# Patient Record
Sex: Female | Born: 1945
Health system: Southern US, Community
[De-identification: ages and names within clinical notes are randomized; demographics above are authoritative.]

## PROBLEM LIST (undated history)

## (undated) DIAGNOSIS — E785 Hyperlipidemia, unspecified: Secondary | ICD-10-CM

## (undated) DIAGNOSIS — I10 Essential (primary) hypertension: Secondary | ICD-10-CM

## (undated) HISTORY — PX: REPLACEMENT TOTAL KNEE: SUR1224

---

## 2017-02-07 DIAGNOSIS — N289 Disorder of kidney and ureter, unspecified: Secondary | ICD-10-CM | POA: Diagnosis not present

## 2017-02-27 DIAGNOSIS — Z79899 Other long term (current) drug therapy: Secondary | ICD-10-CM | POA: Diagnosis not present

## 2017-02-27 DIAGNOSIS — R748 Abnormal levels of other serum enzymes: Secondary | ICD-10-CM | POA: Diagnosis not present

## 2017-02-27 DIAGNOSIS — N2889 Other specified disorders of kidney and ureter: Secondary | ICD-10-CM | POA: Diagnosis not present

## 2017-02-27 DIAGNOSIS — R109 Unspecified abdominal pain: Secondary | ICD-10-CM | POA: Diagnosis not present

## 2017-02-27 DIAGNOSIS — K869 Disease of pancreas, unspecified: Secondary | ICD-10-CM | POA: Diagnosis not present

## 2017-03-03 DIAGNOSIS — I10 Essential (primary) hypertension: Secondary | ICD-10-CM | POA: Diagnosis not present

## 2017-03-05 DIAGNOSIS — N183 Chronic kidney disease, stage 3 (moderate): Secondary | ICD-10-CM | POA: Diagnosis not present

## 2017-03-05 DIAGNOSIS — I10 Essential (primary) hypertension: Secondary | ICD-10-CM | POA: Diagnosis not present

## 2017-03-05 DIAGNOSIS — N390 Urinary tract infection, site not specified: Secondary | ICD-10-CM | POA: Diagnosis not present

## 2017-04-10 DIAGNOSIS — I739 Peripheral vascular disease, unspecified: Secondary | ICD-10-CM | POA: Diagnosis not present

## 2017-04-10 DIAGNOSIS — N032 Chronic nephritic syndrome with diffuse membranous glomerulonephritis: Secondary | ICD-10-CM | POA: Diagnosis not present

## 2017-04-10 DIAGNOSIS — Z0000 Encounter for general adult medical examination without abnormal findings: Secondary | ICD-10-CM | POA: Diagnosis not present

## 2017-04-10 DIAGNOSIS — I119 Hypertensive heart disease without heart failure: Secondary | ICD-10-CM | POA: Diagnosis not present

## 2017-04-10 DIAGNOSIS — E559 Vitamin D deficiency, unspecified: Secondary | ICD-10-CM | POA: Diagnosis not present

## 2017-04-10 DIAGNOSIS — I10 Essential (primary) hypertension: Secondary | ICD-10-CM | POA: Diagnosis not present

## 2017-04-10 DIAGNOSIS — E785 Hyperlipidemia, unspecified: Secondary | ICD-10-CM | POA: Diagnosis not present

## 2017-04-10 DIAGNOSIS — N183 Chronic kidney disease, stage 3 (moderate): Secondary | ICD-10-CM | POA: Diagnosis not present

## 2017-04-10 DIAGNOSIS — E039 Hypothyroidism, unspecified: Secondary | ICD-10-CM | POA: Diagnosis not present

## 2017-04-10 DIAGNOSIS — L409 Psoriasis, unspecified: Secondary | ICD-10-CM | POA: Diagnosis not present

## 2017-05-15 DIAGNOSIS — N289 Disorder of kidney and ureter, unspecified: Secondary | ICD-10-CM | POA: Diagnosis not present

## 2017-07-10 DIAGNOSIS — N183 Chronic kidney disease, stage 3 (moderate): Secondary | ICD-10-CM | POA: Diagnosis not present

## 2017-07-10 DIAGNOSIS — E039 Hypothyroidism, unspecified: Secondary | ICD-10-CM | POA: Diagnosis not present

## 2017-07-10 DIAGNOSIS — I739 Peripheral vascular disease, unspecified: Secondary | ICD-10-CM | POA: Diagnosis not present

## 2017-07-10 DIAGNOSIS — L409 Psoriasis, unspecified: Secondary | ICD-10-CM | POA: Diagnosis not present

## 2017-07-10 DIAGNOSIS — E785 Hyperlipidemia, unspecified: Secondary | ICD-10-CM | POA: Diagnosis not present

## 2017-07-10 DIAGNOSIS — E559 Vitamin D deficiency, unspecified: Secondary | ICD-10-CM | POA: Diagnosis not present

## 2017-07-10 DIAGNOSIS — I119 Hypertensive heart disease without heart failure: Secondary | ICD-10-CM | POA: Diagnosis not present

## 2017-07-10 DIAGNOSIS — N032 Chronic nephritic syndrome with diffuse membranous glomerulonephritis: Secondary | ICD-10-CM | POA: Diagnosis not present

## 2017-07-10 DIAGNOSIS — I10 Essential (primary) hypertension: Secondary | ICD-10-CM | POA: Diagnosis not present

## 2017-08-12 DIAGNOSIS — Z1231 Encounter for screening mammogram for malignant neoplasm of breast: Secondary | ICD-10-CM | POA: Diagnosis not present

## 2017-08-14 DIAGNOSIS — N289 Disorder of kidney and ureter, unspecified: Secondary | ICD-10-CM | POA: Diagnosis not present

## 2017-08-14 DIAGNOSIS — N200 Calculus of kidney: Secondary | ICD-10-CM | POA: Diagnosis not present

## 2017-09-01 DIAGNOSIS — N183 Chronic kidney disease, stage 3 (moderate): Secondary | ICD-10-CM | POA: Diagnosis not present

## 2017-09-01 DIAGNOSIS — I10 Essential (primary) hypertension: Secondary | ICD-10-CM | POA: Diagnosis not present

## 2017-09-02 DIAGNOSIS — K219 Gastro-esophageal reflux disease without esophagitis: Secondary | ICD-10-CM | POA: Diagnosis not present

## 2017-09-02 DIAGNOSIS — K869 Disease of pancreas, unspecified: Secondary | ICD-10-CM | POA: Diagnosis not present

## 2017-09-02 DIAGNOSIS — N2889 Other specified disorders of kidney and ureter: Secondary | ICD-10-CM | POA: Diagnosis not present

## 2017-09-02 DIAGNOSIS — R109 Unspecified abdominal pain: Secondary | ICD-10-CM | POA: Diagnosis not present

## 2017-09-03 DIAGNOSIS — E041 Nontoxic single thyroid nodule: Secondary | ICD-10-CM | POA: Diagnosis not present

## 2017-09-03 DIAGNOSIS — R109 Unspecified abdominal pain: Secondary | ICD-10-CM | POA: Diagnosis not present

## 2017-09-04 DIAGNOSIS — N390 Urinary tract infection, site not specified: Secondary | ICD-10-CM | POA: Diagnosis not present

## 2017-09-04 DIAGNOSIS — I10 Essential (primary) hypertension: Secondary | ICD-10-CM | POA: Diagnosis not present

## 2017-09-04 DIAGNOSIS — N183 Chronic kidney disease, stage 3 (moderate): Secondary | ICD-10-CM | POA: Diagnosis not present

## 2017-10-09 DIAGNOSIS — I739 Peripheral vascular disease, unspecified: Secondary | ICD-10-CM | POA: Diagnosis not present

## 2017-10-09 DIAGNOSIS — E559 Vitamin D deficiency, unspecified: Secondary | ICD-10-CM | POA: Diagnosis not present

## 2017-10-09 DIAGNOSIS — Z23 Encounter for immunization: Secondary | ICD-10-CM | POA: Diagnosis not present

## 2017-10-09 DIAGNOSIS — I119 Hypertensive heart disease without heart failure: Secondary | ICD-10-CM | POA: Diagnosis not present

## 2017-10-09 DIAGNOSIS — I10 Essential (primary) hypertension: Secondary | ICD-10-CM | POA: Diagnosis not present

## 2017-10-09 DIAGNOSIS — M79672 Pain in left foot: Secondary | ICD-10-CM | POA: Diagnosis not present

## 2017-10-09 DIAGNOSIS — N183 Chronic kidney disease, stage 3 (moderate): Secondary | ICD-10-CM | POA: Diagnosis not present

## 2017-10-09 DIAGNOSIS — L409 Psoriasis, unspecified: Secondary | ICD-10-CM | POA: Diagnosis not present

## 2017-10-09 DIAGNOSIS — E039 Hypothyroidism, unspecified: Secondary | ICD-10-CM | POA: Diagnosis not present

## 2017-10-09 DIAGNOSIS — E785 Hyperlipidemia, unspecified: Secondary | ICD-10-CM | POA: Diagnosis not present

## 2017-10-09 DIAGNOSIS — N032 Chronic nephritic syndrome with diffuse membranous glomerulonephritis: Secondary | ICD-10-CM | POA: Diagnosis not present

## 2017-10-09 DIAGNOSIS — R635 Abnormal weight gain: Secondary | ICD-10-CM | POA: Diagnosis not present

## 2017-10-13 DIAGNOSIS — M7732 Calcaneal spur, left foot: Secondary | ICD-10-CM | POA: Diagnosis not present

## 2017-10-13 DIAGNOSIS — M722 Plantar fascial fibromatosis: Secondary | ICD-10-CM | POA: Diagnosis not present

## 2017-10-13 DIAGNOSIS — M7752 Other enthesopathy of left foot: Secondary | ICD-10-CM | POA: Diagnosis not present

## 2017-11-03 DIAGNOSIS — M7752 Other enthesopathy of left foot: Secondary | ICD-10-CM | POA: Diagnosis not present

## 2017-11-03 DIAGNOSIS — M722 Plantar fascial fibromatosis: Secondary | ICD-10-CM | POA: Diagnosis not present

## 2017-11-06 DIAGNOSIS — E039 Hypothyroidism, unspecified: Secondary | ICD-10-CM | POA: Diagnosis not present

## 2017-11-06 DIAGNOSIS — E559 Vitamin D deficiency, unspecified: Secondary | ICD-10-CM | POA: Diagnosis not present

## 2017-11-06 DIAGNOSIS — N032 Chronic nephritic syndrome with diffuse membranous glomerulonephritis: Secondary | ICD-10-CM | POA: Diagnosis not present

## 2017-11-06 DIAGNOSIS — E785 Hyperlipidemia, unspecified: Secondary | ICD-10-CM | POA: Diagnosis not present

## 2017-11-06 DIAGNOSIS — I10 Essential (primary) hypertension: Secondary | ICD-10-CM | POA: Diagnosis not present

## 2017-11-06 DIAGNOSIS — I119 Hypertensive heart disease without heart failure: Secondary | ICD-10-CM | POA: Diagnosis not present

## 2017-11-06 DIAGNOSIS — L409 Psoriasis, unspecified: Secondary | ICD-10-CM | POA: Diagnosis not present

## 2017-11-06 DIAGNOSIS — R635 Abnormal weight gain: Secondary | ICD-10-CM | POA: Diagnosis not present

## 2017-11-06 DIAGNOSIS — N183 Chronic kidney disease, stage 3 (moderate): Secondary | ICD-10-CM | POA: Diagnosis not present

## 2017-11-17 DIAGNOSIS — N289 Disorder of kidney and ureter, unspecified: Secondary | ICD-10-CM | POA: Diagnosis not present

## 2017-11-17 DIAGNOSIS — M722 Plantar fascial fibromatosis: Secondary | ICD-10-CM | POA: Diagnosis not present

## 2017-11-17 DIAGNOSIS — M7752 Other enthesopathy of left foot: Secondary | ICD-10-CM | POA: Diagnosis not present

## 2018-02-05 DIAGNOSIS — I119 Hypertensive heart disease without heart failure: Secondary | ICD-10-CM | POA: Diagnosis not present

## 2018-02-05 DIAGNOSIS — R300 Dysuria: Secondary | ICD-10-CM | POA: Diagnosis not present

## 2018-02-05 DIAGNOSIS — E039 Hypothyroidism, unspecified: Secondary | ICD-10-CM | POA: Diagnosis not present

## 2018-02-05 DIAGNOSIS — N032 Chronic nephritic syndrome with diffuse membranous glomerulonephritis: Secondary | ICD-10-CM | POA: Diagnosis not present

## 2018-02-05 DIAGNOSIS — E785 Hyperlipidemia, unspecified: Secondary | ICD-10-CM | POA: Diagnosis not present

## 2018-02-05 DIAGNOSIS — L409 Psoriasis, unspecified: Secondary | ICD-10-CM | POA: Diagnosis not present

## 2018-02-05 DIAGNOSIS — E559 Vitamin D deficiency, unspecified: Secondary | ICD-10-CM | POA: Diagnosis not present

## 2018-02-05 DIAGNOSIS — N183 Chronic kidney disease, stage 3 (moderate): Secondary | ICD-10-CM | POA: Diagnosis not present

## 2018-02-05 DIAGNOSIS — R635 Abnormal weight gain: Secondary | ICD-10-CM | POA: Diagnosis not present

## 2018-02-05 DIAGNOSIS — I10 Essential (primary) hypertension: Secondary | ICD-10-CM | POA: Diagnosis not present

## 2018-02-17 DIAGNOSIS — I10 Essential (primary) hypertension: Secondary | ICD-10-CM | POA: Diagnosis not present

## 2018-02-17 DIAGNOSIS — R635 Abnormal weight gain: Secondary | ICD-10-CM | POA: Diagnosis not present

## 2018-02-17 DIAGNOSIS — I119 Hypertensive heart disease without heart failure: Secondary | ICD-10-CM | POA: Diagnosis not present

## 2018-02-17 DIAGNOSIS — L409 Psoriasis, unspecified: Secondary | ICD-10-CM | POA: Diagnosis not present

## 2018-02-17 DIAGNOSIS — M25561 Pain in right knee: Secondary | ICD-10-CM | POA: Diagnosis not present

## 2018-02-17 DIAGNOSIS — N183 Chronic kidney disease, stage 3 (moderate): Secondary | ICD-10-CM | POA: Diagnosis not present

## 2018-02-17 DIAGNOSIS — N032 Chronic nephritic syndrome with diffuse membranous glomerulonephritis: Secondary | ICD-10-CM | POA: Diagnosis not present

## 2018-02-17 DIAGNOSIS — E785 Hyperlipidemia, unspecified: Secondary | ICD-10-CM | POA: Diagnosis not present

## 2018-02-17 DIAGNOSIS — E039 Hypothyroidism, unspecified: Secondary | ICD-10-CM | POA: Diagnosis not present

## 2018-02-17 DIAGNOSIS — E559 Vitamin D deficiency, unspecified: Secondary | ICD-10-CM | POA: Diagnosis not present

## 2018-02-23 DIAGNOSIS — M1711 Unilateral primary osteoarthritis, right knee: Secondary | ICD-10-CM | POA: Diagnosis not present

## 2018-03-02 DIAGNOSIS — N183 Chronic kidney disease, stage 3 (moderate): Secondary | ICD-10-CM | POA: Diagnosis not present

## 2018-03-02 DIAGNOSIS — E559 Vitamin D deficiency, unspecified: Secondary | ICD-10-CM | POA: Diagnosis not present

## 2018-03-02 DIAGNOSIS — M1711 Unilateral primary osteoarthritis, right knee: Secondary | ICD-10-CM | POA: Diagnosis not present

## 2018-03-02 DIAGNOSIS — I10 Essential (primary) hypertension: Secondary | ICD-10-CM | POA: Diagnosis not present

## 2018-03-03 DIAGNOSIS — N2889 Other specified disorders of kidney and ureter: Secondary | ICD-10-CM | POA: Diagnosis not present

## 2018-03-03 DIAGNOSIS — R0789 Other chest pain: Secondary | ICD-10-CM | POA: Diagnosis not present

## 2018-03-03 DIAGNOSIS — R109 Unspecified abdominal pain: Secondary | ICD-10-CM | POA: Diagnosis not present

## 2018-03-03 DIAGNOSIS — I10 Essential (primary) hypertension: Secondary | ICD-10-CM | POA: Diagnosis not present

## 2018-03-03 DIAGNOSIS — K219 Gastro-esophageal reflux disease without esophagitis: Secondary | ICD-10-CM | POA: Diagnosis not present

## 2018-03-03 DIAGNOSIS — K869 Disease of pancreas, unspecified: Secondary | ICD-10-CM | POA: Diagnosis not present

## 2018-03-04 DIAGNOSIS — R109 Unspecified abdominal pain: Secondary | ICD-10-CM | POA: Diagnosis not present

## 2018-03-04 DIAGNOSIS — N183 Chronic kidney disease, stage 3 (moderate): Secondary | ICD-10-CM | POA: Diagnosis not present

## 2018-03-04 DIAGNOSIS — N390 Urinary tract infection, site not specified: Secondary | ICD-10-CM | POA: Diagnosis not present

## 2018-03-04 DIAGNOSIS — I10 Essential (primary) hypertension: Secondary | ICD-10-CM | POA: Diagnosis not present

## 2018-03-05 DIAGNOSIS — N289 Disorder of kidney and ureter, unspecified: Secondary | ICD-10-CM | POA: Diagnosis not present

## 2018-03-30 DIAGNOSIS — L409 Psoriasis, unspecified: Secondary | ICD-10-CM | POA: Diagnosis not present

## 2018-03-30 DIAGNOSIS — N183 Chronic kidney disease, stage 3 (moderate): Secondary | ICD-10-CM | POA: Diagnosis not present

## 2018-03-30 DIAGNOSIS — I119 Hypertensive heart disease without heart failure: Secondary | ICD-10-CM | POA: Diagnosis not present

## 2018-03-30 DIAGNOSIS — I10 Essential (primary) hypertension: Secondary | ICD-10-CM | POA: Diagnosis not present

## 2018-03-30 DIAGNOSIS — E559 Vitamin D deficiency, unspecified: Secondary | ICD-10-CM | POA: Diagnosis not present

## 2018-03-30 DIAGNOSIS — R635 Abnormal weight gain: Secondary | ICD-10-CM | POA: Diagnosis not present

## 2018-03-30 DIAGNOSIS — Z0000 Encounter for general adult medical examination without abnormal findings: Secondary | ICD-10-CM | POA: Diagnosis not present

## 2018-03-30 DIAGNOSIS — M1711 Unilateral primary osteoarthritis, right knee: Secondary | ICD-10-CM | POA: Diagnosis not present

## 2018-03-30 DIAGNOSIS — E785 Hyperlipidemia, unspecified: Secondary | ICD-10-CM | POA: Diagnosis not present

## 2018-03-30 DIAGNOSIS — E039 Hypothyroidism, unspecified: Secondary | ICD-10-CM | POA: Diagnosis not present

## 2018-03-30 DIAGNOSIS — N032 Chronic nephritic syndrome with diffuse membranous glomerulonephritis: Secondary | ICD-10-CM | POA: Diagnosis not present

## 2018-04-01 DIAGNOSIS — M1711 Unilateral primary osteoarthritis, right knee: Secondary | ICD-10-CM | POA: Diagnosis not present

## 2018-04-01 DIAGNOSIS — M25561 Pain in right knee: Secondary | ICD-10-CM | POA: Diagnosis not present

## 2018-04-06 DIAGNOSIS — R739 Hyperglycemia, unspecified: Secondary | ICD-10-CM | POA: Diagnosis not present

## 2018-04-06 DIAGNOSIS — M1711 Unilateral primary osteoarthritis, right knee: Secondary | ICD-10-CM | POA: Diagnosis not present

## 2018-04-14 DIAGNOSIS — R739 Hyperglycemia, unspecified: Secondary | ICD-10-CM | POA: Diagnosis not present

## 2018-04-14 DIAGNOSIS — M1711 Unilateral primary osteoarthritis, right knee: Secondary | ICD-10-CM | POA: Diagnosis not present

## 2018-04-23 DIAGNOSIS — I447 Left bundle-branch block, unspecified: Secondary | ICD-10-CM | POA: Diagnosis not present

## 2018-04-23 DIAGNOSIS — R9431 Abnormal electrocardiogram [ECG] [EKG]: Secondary | ICD-10-CM | POA: Diagnosis not present

## 2018-04-23 DIAGNOSIS — M1711 Unilateral primary osteoarthritis, right knee: Secondary | ICD-10-CM | POA: Diagnosis not present

## 2018-04-23 DIAGNOSIS — Z01818 Encounter for other preprocedural examination: Secondary | ICD-10-CM | POA: Diagnosis not present

## 2018-04-24 DIAGNOSIS — N183 Chronic kidney disease, stage 3 (moderate): Secondary | ICD-10-CM | POA: Diagnosis not present

## 2018-04-24 DIAGNOSIS — Z01810 Encounter for preprocedural cardiovascular examination: Secondary | ICD-10-CM | POA: Diagnosis not present

## 2018-04-24 DIAGNOSIS — I119 Hypertensive heart disease without heart failure: Secondary | ICD-10-CM | POA: Diagnosis not present

## 2018-04-24 DIAGNOSIS — E785 Hyperlipidemia, unspecified: Secondary | ICD-10-CM | POA: Diagnosis not present

## 2018-04-24 DIAGNOSIS — E559 Vitamin D deficiency, unspecified: Secondary | ICD-10-CM | POA: Diagnosis not present

## 2018-04-24 DIAGNOSIS — I10 Essential (primary) hypertension: Secondary | ICD-10-CM | POA: Diagnosis not present

## 2018-04-24 DIAGNOSIS — Z01811 Encounter for preprocedural respiratory examination: Secondary | ICD-10-CM | POA: Diagnosis not present

## 2018-04-24 DIAGNOSIS — E039 Hypothyroidism, unspecified: Secondary | ICD-10-CM | POA: Diagnosis not present

## 2018-04-24 DIAGNOSIS — L409 Psoriasis, unspecified: Secondary | ICD-10-CM | POA: Diagnosis not present

## 2018-04-24 DIAGNOSIS — N032 Chronic nephritic syndrome with diffuse membranous glomerulonephritis: Secondary | ICD-10-CM | POA: Diagnosis not present

## 2018-05-01 DIAGNOSIS — R6889 Other general symptoms and signs: Secondary | ICD-10-CM | POA: Diagnosis not present

## 2018-05-01 DIAGNOSIS — M25561 Pain in right knee: Secondary | ICD-10-CM | POA: Diagnosis not present

## 2018-05-01 DIAGNOSIS — R262 Difficulty in walking, not elsewhere classified: Secondary | ICD-10-CM | POA: Diagnosis not present

## 2018-05-01 DIAGNOSIS — G8929 Other chronic pain: Secondary | ICD-10-CM | POA: Diagnosis not present

## 2018-05-01 DIAGNOSIS — M1711 Unilateral primary osteoarthritis, right knee: Secondary | ICD-10-CM | POA: Diagnosis not present

## 2018-05-01 DIAGNOSIS — M25661 Stiffness of right knee, not elsewhere classified: Secondary | ICD-10-CM | POA: Diagnosis not present

## 2018-05-06 DIAGNOSIS — K219 Gastro-esophageal reflux disease without esophagitis: Secondary | ICD-10-CM | POA: Diagnosis not present

## 2018-05-06 DIAGNOSIS — Z7982 Long term (current) use of aspirin: Secondary | ICD-10-CM | POA: Diagnosis not present

## 2018-05-06 DIAGNOSIS — M1711 Unilateral primary osteoarthritis, right knee: Secondary | ICD-10-CM | POA: Diagnosis not present

## 2018-05-06 DIAGNOSIS — Z90710 Acquired absence of both cervix and uterus: Secondary | ICD-10-CM | POA: Diagnosis not present

## 2018-05-06 DIAGNOSIS — I10 Essential (primary) hypertension: Secondary | ICD-10-CM | POA: Diagnosis not present

## 2018-05-06 DIAGNOSIS — E039 Hypothyroidism, unspecified: Secondary | ICD-10-CM | POA: Diagnosis not present

## 2018-05-07 DIAGNOSIS — M1711 Unilateral primary osteoarthritis, right knee: Secondary | ICD-10-CM | POA: Diagnosis not present

## 2018-05-07 DIAGNOSIS — K219 Gastro-esophageal reflux disease without esophagitis: Secondary | ICD-10-CM | POA: Diagnosis not present

## 2018-05-07 DIAGNOSIS — Z7982 Long term (current) use of aspirin: Secondary | ICD-10-CM | POA: Diagnosis not present

## 2018-05-07 DIAGNOSIS — I10 Essential (primary) hypertension: Secondary | ICD-10-CM | POA: Diagnosis not present

## 2018-05-07 DIAGNOSIS — E039 Hypothyroidism, unspecified: Secondary | ICD-10-CM | POA: Diagnosis not present

## 2018-05-07 DIAGNOSIS — Z90710 Acquired absence of both cervix and uterus: Secondary | ICD-10-CM | POA: Diagnosis not present

## 2018-05-11 DIAGNOSIS — M25561 Pain in right knee: Secondary | ICD-10-CM | POA: Diagnosis not present

## 2018-05-11 DIAGNOSIS — Z96651 Presence of right artificial knee joint: Secondary | ICD-10-CM | POA: Diagnosis not present

## 2018-05-11 DIAGNOSIS — R6889 Other general symptoms and signs: Secondary | ICD-10-CM | POA: Diagnosis not present

## 2018-05-11 DIAGNOSIS — G8929 Other chronic pain: Secondary | ICD-10-CM | POA: Diagnosis not present

## 2018-05-11 DIAGNOSIS — M1711 Unilateral primary osteoarthritis, right knee: Secondary | ICD-10-CM | POA: Diagnosis not present

## 2018-05-11 DIAGNOSIS — R262 Difficulty in walking, not elsewhere classified: Secondary | ICD-10-CM | POA: Diagnosis not present

## 2018-05-11 DIAGNOSIS — M25661 Stiffness of right knee, not elsewhere classified: Secondary | ICD-10-CM | POA: Diagnosis not present

## 2018-05-11 DIAGNOSIS — Z7409 Other reduced mobility: Secondary | ICD-10-CM | POA: Diagnosis not present

## 2018-05-14 DIAGNOSIS — M25661 Stiffness of right knee, not elsewhere classified: Secondary | ICD-10-CM | POA: Diagnosis not present

## 2018-05-14 DIAGNOSIS — Z7409 Other reduced mobility: Secondary | ICD-10-CM | POA: Diagnosis not present

## 2018-05-14 DIAGNOSIS — R6889 Other general symptoms and signs: Secondary | ICD-10-CM | POA: Diagnosis not present

## 2018-05-14 DIAGNOSIS — R262 Difficulty in walking, not elsewhere classified: Secondary | ICD-10-CM | POA: Diagnosis not present

## 2018-05-14 DIAGNOSIS — M1711 Unilateral primary osteoarthritis, right knee: Secondary | ICD-10-CM | POA: Diagnosis not present

## 2018-05-14 DIAGNOSIS — G8929 Other chronic pain: Secondary | ICD-10-CM | POA: Diagnosis not present

## 2018-05-14 DIAGNOSIS — M25561 Pain in right knee: Secondary | ICD-10-CM | POA: Diagnosis not present

## 2018-05-14 DIAGNOSIS — Z96651 Presence of right artificial knee joint: Secondary | ICD-10-CM | POA: Diagnosis not present

## 2018-05-19 DIAGNOSIS — Z96651 Presence of right artificial knee joint: Secondary | ICD-10-CM | POA: Diagnosis not present

## 2018-05-19 DIAGNOSIS — R262 Difficulty in walking, not elsewhere classified: Secondary | ICD-10-CM | POA: Diagnosis not present

## 2018-05-19 DIAGNOSIS — M25661 Stiffness of right knee, not elsewhere classified: Secondary | ICD-10-CM | POA: Diagnosis not present

## 2018-05-19 DIAGNOSIS — Z7409 Other reduced mobility: Secondary | ICD-10-CM | POA: Diagnosis not present

## 2018-05-19 DIAGNOSIS — R6889 Other general symptoms and signs: Secondary | ICD-10-CM | POA: Diagnosis not present

## 2018-05-19 DIAGNOSIS — M25561 Pain in right knee: Secondary | ICD-10-CM | POA: Diagnosis not present

## 2018-05-19 DIAGNOSIS — G8929 Other chronic pain: Secondary | ICD-10-CM | POA: Diagnosis not present

## 2018-05-19 DIAGNOSIS — M1711 Unilateral primary osteoarthritis, right knee: Secondary | ICD-10-CM | POA: Diagnosis not present

## 2018-05-21 DIAGNOSIS — Z7409 Other reduced mobility: Secondary | ICD-10-CM | POA: Diagnosis not present

## 2018-05-21 DIAGNOSIS — R262 Difficulty in walking, not elsewhere classified: Secondary | ICD-10-CM | POA: Diagnosis not present

## 2018-05-21 DIAGNOSIS — R6889 Other general symptoms and signs: Secondary | ICD-10-CM | POA: Diagnosis not present

## 2018-05-21 DIAGNOSIS — Z96651 Presence of right artificial knee joint: Secondary | ICD-10-CM | POA: Diagnosis not present

## 2018-05-21 DIAGNOSIS — M1711 Unilateral primary osteoarthritis, right knee: Secondary | ICD-10-CM | POA: Diagnosis not present

## 2018-05-21 DIAGNOSIS — G8929 Other chronic pain: Secondary | ICD-10-CM | POA: Diagnosis not present

## 2018-05-21 DIAGNOSIS — M25561 Pain in right knee: Secondary | ICD-10-CM | POA: Diagnosis not present

## 2018-05-21 DIAGNOSIS — M25661 Stiffness of right knee, not elsewhere classified: Secondary | ICD-10-CM | POA: Diagnosis not present

## 2018-05-26 DIAGNOSIS — M1711 Unilateral primary osteoarthritis, right knee: Secondary | ICD-10-CM | POA: Diagnosis not present

## 2018-05-26 DIAGNOSIS — M25561 Pain in right knee: Secondary | ICD-10-CM | POA: Diagnosis not present

## 2018-05-26 DIAGNOSIS — G8929 Other chronic pain: Secondary | ICD-10-CM | POA: Diagnosis not present

## 2018-05-26 DIAGNOSIS — R262 Difficulty in walking, not elsewhere classified: Secondary | ICD-10-CM | POA: Diagnosis not present

## 2018-05-26 DIAGNOSIS — M25661 Stiffness of right knee, not elsewhere classified: Secondary | ICD-10-CM | POA: Diagnosis not present

## 2018-05-26 DIAGNOSIS — Z7409 Other reduced mobility: Secondary | ICD-10-CM | POA: Diagnosis not present

## 2018-05-26 DIAGNOSIS — R6889 Other general symptoms and signs: Secondary | ICD-10-CM | POA: Diagnosis not present

## 2018-05-28 DIAGNOSIS — R262 Difficulty in walking, not elsewhere classified: Secondary | ICD-10-CM | POA: Diagnosis not present

## 2018-05-28 DIAGNOSIS — Z96651 Presence of right artificial knee joint: Secondary | ICD-10-CM | POA: Diagnosis not present

## 2018-05-28 DIAGNOSIS — Z7409 Other reduced mobility: Secondary | ICD-10-CM | POA: Diagnosis not present

## 2018-05-28 DIAGNOSIS — G8929 Other chronic pain: Secondary | ICD-10-CM | POA: Diagnosis not present

## 2018-05-28 DIAGNOSIS — R6889 Other general symptoms and signs: Secondary | ICD-10-CM | POA: Diagnosis not present

## 2018-05-28 DIAGNOSIS — M25561 Pain in right knee: Secondary | ICD-10-CM | POA: Diagnosis not present

## 2018-05-28 DIAGNOSIS — M25661 Stiffness of right knee, not elsewhere classified: Secondary | ICD-10-CM | POA: Diagnosis not present

## 2018-05-28 DIAGNOSIS — M1711 Unilateral primary osteoarthritis, right knee: Secondary | ICD-10-CM | POA: Diagnosis not present

## 2018-06-02 DIAGNOSIS — R6889 Other general symptoms and signs: Secondary | ICD-10-CM | POA: Diagnosis not present

## 2018-06-02 DIAGNOSIS — R262 Difficulty in walking, not elsewhere classified: Secondary | ICD-10-CM | POA: Diagnosis not present

## 2018-06-02 DIAGNOSIS — G8929 Other chronic pain: Secondary | ICD-10-CM | POA: Diagnosis not present

## 2018-06-02 DIAGNOSIS — M25661 Stiffness of right knee, not elsewhere classified: Secondary | ICD-10-CM | POA: Diagnosis not present

## 2018-06-02 DIAGNOSIS — M25561 Pain in right knee: Secondary | ICD-10-CM | POA: Diagnosis not present

## 2018-06-02 DIAGNOSIS — M1711 Unilateral primary osteoarthritis, right knee: Secondary | ICD-10-CM | POA: Diagnosis not present

## 2018-06-04 DIAGNOSIS — R262 Difficulty in walking, not elsewhere classified: Secondary | ICD-10-CM | POA: Diagnosis not present

## 2018-06-04 DIAGNOSIS — M25561 Pain in right knee: Secondary | ICD-10-CM | POA: Diagnosis not present

## 2018-06-04 DIAGNOSIS — Z96651 Presence of right artificial knee joint: Secondary | ICD-10-CM | POA: Diagnosis not present

## 2018-06-04 DIAGNOSIS — R6889 Other general symptoms and signs: Secondary | ICD-10-CM | POA: Diagnosis not present

## 2018-06-04 DIAGNOSIS — M1711 Unilateral primary osteoarthritis, right knee: Secondary | ICD-10-CM | POA: Diagnosis not present

## 2018-06-04 DIAGNOSIS — G8929 Other chronic pain: Secondary | ICD-10-CM | POA: Diagnosis not present

## 2018-06-04 DIAGNOSIS — Z7409 Other reduced mobility: Secondary | ICD-10-CM | POA: Diagnosis not present

## 2018-06-04 DIAGNOSIS — N289 Disorder of kidney and ureter, unspecified: Secondary | ICD-10-CM | POA: Diagnosis not present

## 2018-06-04 DIAGNOSIS — M25661 Stiffness of right knee, not elsewhere classified: Secondary | ICD-10-CM | POA: Diagnosis not present

## 2018-06-09 DIAGNOSIS — Z7409 Other reduced mobility: Secondary | ICD-10-CM | POA: Diagnosis not present

## 2018-06-09 DIAGNOSIS — G8929 Other chronic pain: Secondary | ICD-10-CM | POA: Diagnosis not present

## 2018-06-09 DIAGNOSIS — M25561 Pain in right knee: Secondary | ICD-10-CM | POA: Diagnosis not present

## 2018-06-09 DIAGNOSIS — M25661 Stiffness of right knee, not elsewhere classified: Secondary | ICD-10-CM | POA: Diagnosis not present

## 2018-06-09 DIAGNOSIS — R6889 Other general symptoms and signs: Secondary | ICD-10-CM | POA: Diagnosis not present

## 2018-06-09 DIAGNOSIS — Z96651 Presence of right artificial knee joint: Secondary | ICD-10-CM | POA: Diagnosis not present

## 2018-06-09 DIAGNOSIS — R262 Difficulty in walking, not elsewhere classified: Secondary | ICD-10-CM | POA: Diagnosis not present

## 2018-06-11 DIAGNOSIS — M1711 Unilateral primary osteoarthritis, right knee: Secondary | ICD-10-CM | POA: Diagnosis not present

## 2018-06-11 DIAGNOSIS — Z7409 Other reduced mobility: Secondary | ICD-10-CM | POA: Diagnosis not present

## 2018-06-11 DIAGNOSIS — G8929 Other chronic pain: Secondary | ICD-10-CM | POA: Diagnosis not present

## 2018-06-11 DIAGNOSIS — R6889 Other general symptoms and signs: Secondary | ICD-10-CM | POA: Diagnosis not present

## 2018-06-11 DIAGNOSIS — M25661 Stiffness of right knee, not elsewhere classified: Secondary | ICD-10-CM | POA: Diagnosis not present

## 2018-06-11 DIAGNOSIS — Z96651 Presence of right artificial knee joint: Secondary | ICD-10-CM | POA: Diagnosis not present

## 2018-06-11 DIAGNOSIS — M25561 Pain in right knee: Secondary | ICD-10-CM | POA: Diagnosis not present

## 2018-06-11 DIAGNOSIS — R262 Difficulty in walking, not elsewhere classified: Secondary | ICD-10-CM | POA: Diagnosis not present

## 2018-06-16 DIAGNOSIS — M25561 Pain in right knee: Secondary | ICD-10-CM | POA: Diagnosis not present

## 2018-06-16 DIAGNOSIS — G8929 Other chronic pain: Secondary | ICD-10-CM | POA: Diagnosis not present

## 2018-06-16 DIAGNOSIS — M25661 Stiffness of right knee, not elsewhere classified: Secondary | ICD-10-CM | POA: Diagnosis not present

## 2018-06-16 DIAGNOSIS — R6889 Other general symptoms and signs: Secondary | ICD-10-CM | POA: Diagnosis not present

## 2018-06-16 DIAGNOSIS — R262 Difficulty in walking, not elsewhere classified: Secondary | ICD-10-CM | POA: Diagnosis not present

## 2018-06-16 DIAGNOSIS — Z7409 Other reduced mobility: Secondary | ICD-10-CM | POA: Diagnosis not present

## 2018-06-18 DIAGNOSIS — M25561 Pain in right knee: Secondary | ICD-10-CM | POA: Diagnosis not present

## 2018-06-18 DIAGNOSIS — G8929 Other chronic pain: Secondary | ICD-10-CM | POA: Diagnosis not present

## 2018-06-18 DIAGNOSIS — Z7409 Other reduced mobility: Secondary | ICD-10-CM | POA: Diagnosis not present

## 2018-06-18 DIAGNOSIS — M25661 Stiffness of right knee, not elsewhere classified: Secondary | ICD-10-CM | POA: Diagnosis not present

## 2018-06-18 DIAGNOSIS — R6889 Other general symptoms and signs: Secondary | ICD-10-CM | POA: Diagnosis not present

## 2018-06-18 DIAGNOSIS — R262 Difficulty in walking, not elsewhere classified: Secondary | ICD-10-CM | POA: Diagnosis not present

## 2018-06-18 DIAGNOSIS — Z96651 Presence of right artificial knee joint: Secondary | ICD-10-CM | POA: Diagnosis not present

## 2018-06-18 DIAGNOSIS — M1711 Unilateral primary osteoarthritis, right knee: Secondary | ICD-10-CM | POA: Diagnosis not present

## 2018-06-23 DIAGNOSIS — G8929 Other chronic pain: Secondary | ICD-10-CM | POA: Diagnosis not present

## 2018-06-23 DIAGNOSIS — M25661 Stiffness of right knee, not elsewhere classified: Secondary | ICD-10-CM | POA: Diagnosis not present

## 2018-06-23 DIAGNOSIS — M25561 Pain in right knee: Secondary | ICD-10-CM | POA: Diagnosis not present

## 2018-06-23 DIAGNOSIS — R262 Difficulty in walking, not elsewhere classified: Secondary | ICD-10-CM | POA: Diagnosis not present

## 2018-06-23 DIAGNOSIS — R6889 Other general symptoms and signs: Secondary | ICD-10-CM | POA: Diagnosis not present

## 2018-06-26 DIAGNOSIS — M25661 Stiffness of right knee, not elsewhere classified: Secondary | ICD-10-CM | POA: Diagnosis not present

## 2018-06-26 DIAGNOSIS — M1711 Unilateral primary osteoarthritis, right knee: Secondary | ICD-10-CM | POA: Diagnosis not present

## 2018-06-26 DIAGNOSIS — R6889 Other general symptoms and signs: Secondary | ICD-10-CM | POA: Diagnosis not present

## 2018-06-26 DIAGNOSIS — G8929 Other chronic pain: Secondary | ICD-10-CM | POA: Diagnosis not present

## 2018-06-26 DIAGNOSIS — Z7409 Other reduced mobility: Secondary | ICD-10-CM | POA: Diagnosis not present

## 2018-06-26 DIAGNOSIS — R262 Difficulty in walking, not elsewhere classified: Secondary | ICD-10-CM | POA: Diagnosis not present

## 2018-06-26 DIAGNOSIS — Z96651 Presence of right artificial knee joint: Secondary | ICD-10-CM | POA: Diagnosis not present

## 2018-06-26 DIAGNOSIS — M25561 Pain in right knee: Secondary | ICD-10-CM | POA: Diagnosis not present

## 2018-06-29 DIAGNOSIS — I119 Hypertensive heart disease without heart failure: Secondary | ICD-10-CM | POA: Diagnosis not present

## 2018-06-29 DIAGNOSIS — E039 Hypothyroidism, unspecified: Secondary | ICD-10-CM | POA: Diagnosis not present

## 2018-06-29 DIAGNOSIS — N183 Chronic kidney disease, stage 3 (moderate): Secondary | ICD-10-CM | POA: Diagnosis not present

## 2018-06-29 DIAGNOSIS — L409 Psoriasis, unspecified: Secondary | ICD-10-CM | POA: Diagnosis not present

## 2018-06-29 DIAGNOSIS — I10 Essential (primary) hypertension: Secondary | ICD-10-CM | POA: Diagnosis not present

## 2018-06-29 DIAGNOSIS — N032 Chronic nephritic syndrome with diffuse membranous glomerulonephritis: Secondary | ICD-10-CM | POA: Diagnosis not present

## 2018-06-29 DIAGNOSIS — E559 Vitamin D deficiency, unspecified: Secondary | ICD-10-CM | POA: Diagnosis not present

## 2018-06-29 DIAGNOSIS — E785 Hyperlipidemia, unspecified: Secondary | ICD-10-CM | POA: Diagnosis not present

## 2018-06-30 DIAGNOSIS — R262 Difficulty in walking, not elsewhere classified: Secondary | ICD-10-CM | POA: Diagnosis not present

## 2018-06-30 DIAGNOSIS — G8929 Other chronic pain: Secondary | ICD-10-CM | POA: Diagnosis not present

## 2018-06-30 DIAGNOSIS — M25561 Pain in right knee: Secondary | ICD-10-CM | POA: Diagnosis not present

## 2018-06-30 DIAGNOSIS — M25661 Stiffness of right knee, not elsewhere classified: Secondary | ICD-10-CM | POA: Diagnosis not present

## 2018-09-01 DIAGNOSIS — N183 Chronic kidney disease, stage 3 (moderate): Secondary | ICD-10-CM | POA: Diagnosis not present

## 2018-09-01 DIAGNOSIS — I10 Essential (primary) hypertension: Secondary | ICD-10-CM | POA: Diagnosis not present

## 2018-09-03 DIAGNOSIS — I10 Essential (primary) hypertension: Secondary | ICD-10-CM | POA: Diagnosis not present

## 2018-09-03 DIAGNOSIS — N390 Urinary tract infection, site not specified: Secondary | ICD-10-CM | POA: Diagnosis not present

## 2018-09-03 DIAGNOSIS — N183 Chronic kidney disease, stage 3 (moderate): Secondary | ICD-10-CM | POA: Diagnosis not present

## 2018-09-03 DIAGNOSIS — R809 Proteinuria, unspecified: Secondary | ICD-10-CM | POA: Diagnosis not present

## 2018-09-04 DIAGNOSIS — N200 Calculus of kidney: Secondary | ICD-10-CM | POA: Diagnosis not present

## 2018-09-04 DIAGNOSIS — R300 Dysuria: Secondary | ICD-10-CM | POA: Diagnosis not present

## 2018-09-15 DIAGNOSIS — K219 Gastro-esophageal reflux disease without esophagitis: Secondary | ICD-10-CM | POA: Diagnosis not present

## 2018-09-15 DIAGNOSIS — R109 Unspecified abdominal pain: Secondary | ICD-10-CM | POA: Diagnosis not present

## 2018-09-15 DIAGNOSIS — K8689 Other specified diseases of pancreas: Secondary | ICD-10-CM | POA: Diagnosis not present

## 2018-09-15 DIAGNOSIS — N2889 Other specified disorders of kidney and ureter: Secondary | ICD-10-CM | POA: Diagnosis not present

## 2018-09-22 DIAGNOSIS — Z1239 Encounter for other screening for malignant neoplasm of breast: Secondary | ICD-10-CM | POA: Diagnosis not present

## 2018-09-22 DIAGNOSIS — Z1231 Encounter for screening mammogram for malignant neoplasm of breast: Secondary | ICD-10-CM | POA: Diagnosis not present

## 2018-10-09 DIAGNOSIS — E559 Vitamin D deficiency, unspecified: Secondary | ICD-10-CM | POA: Diagnosis not present

## 2018-10-09 DIAGNOSIS — N183 Chronic kidney disease, stage 3 (moderate): Secondary | ICD-10-CM | POA: Diagnosis not present

## 2018-10-09 DIAGNOSIS — I10 Essential (primary) hypertension: Secondary | ICD-10-CM | POA: Diagnosis not present

## 2018-10-09 DIAGNOSIS — L409 Psoriasis, unspecified: Secondary | ICD-10-CM | POA: Diagnosis not present

## 2018-10-09 DIAGNOSIS — I119 Hypertensive heart disease without heart failure: Secondary | ICD-10-CM | POA: Diagnosis not present

## 2018-10-09 DIAGNOSIS — E039 Hypothyroidism, unspecified: Secondary | ICD-10-CM | POA: Diagnosis not present

## 2018-10-09 DIAGNOSIS — E785 Hyperlipidemia, unspecified: Secondary | ICD-10-CM | POA: Diagnosis not present

## 2018-10-09 DIAGNOSIS — N032 Chronic nephritic syndrome with diffuse membranous glomerulonephritis: Secondary | ICD-10-CM | POA: Diagnosis not present

## 2018-10-09 DIAGNOSIS — Z23 Encounter for immunization: Secondary | ICD-10-CM | POA: Diagnosis not present

## 2018-10-26 DIAGNOSIS — L409 Psoriasis, unspecified: Secondary | ICD-10-CM | POA: Diagnosis not present

## 2018-10-26 DIAGNOSIS — E559 Vitamin D deficiency, unspecified: Secondary | ICD-10-CM | POA: Diagnosis not present

## 2018-10-26 DIAGNOSIS — N032 Chronic nephritic syndrome with diffuse membranous glomerulonephritis: Secondary | ICD-10-CM | POA: Diagnosis not present

## 2018-10-26 DIAGNOSIS — E785 Hyperlipidemia, unspecified: Secondary | ICD-10-CM | POA: Diagnosis not present

## 2018-10-26 DIAGNOSIS — I119 Hypertensive heart disease without heart failure: Secondary | ICD-10-CM | POA: Diagnosis not present

## 2018-10-26 DIAGNOSIS — E039 Hypothyroidism, unspecified: Secondary | ICD-10-CM | POA: Diagnosis not present

## 2018-10-26 DIAGNOSIS — I10 Essential (primary) hypertension: Secondary | ICD-10-CM | POA: Diagnosis not present

## 2018-10-26 DIAGNOSIS — N183 Chronic kidney disease, stage 3 (moderate): Secondary | ICD-10-CM | POA: Diagnosis not present

## 2018-12-07 DIAGNOSIS — N289 Disorder of kidney and ureter, unspecified: Secondary | ICD-10-CM | POA: Diagnosis not present

## 2018-12-15 DIAGNOSIS — N183 Chronic kidney disease, stage 3 (moderate): Secondary | ICD-10-CM | POA: Diagnosis not present

## 2018-12-15 DIAGNOSIS — L409 Psoriasis, unspecified: Secondary | ICD-10-CM | POA: Diagnosis not present

## 2018-12-15 DIAGNOSIS — I10 Essential (primary) hypertension: Secondary | ICD-10-CM | POA: Diagnosis not present

## 2018-12-15 DIAGNOSIS — N032 Chronic nephritic syndrome with diffuse membranous glomerulonephritis: Secondary | ICD-10-CM | POA: Diagnosis not present

## 2018-12-15 DIAGNOSIS — I119 Hypertensive heart disease without heart failure: Secondary | ICD-10-CM | POA: Diagnosis not present

## 2018-12-15 DIAGNOSIS — E039 Hypothyroidism, unspecified: Secondary | ICD-10-CM | POA: Diagnosis not present

## 2018-12-15 DIAGNOSIS — E785 Hyperlipidemia, unspecified: Secondary | ICD-10-CM | POA: Diagnosis not present

## 2018-12-15 DIAGNOSIS — R0781 Pleurodynia: Secondary | ICD-10-CM | POA: Diagnosis not present

## 2018-12-15 DIAGNOSIS — E559 Vitamin D deficiency, unspecified: Secondary | ICD-10-CM | POA: Diagnosis not present

## 2019-03-02 DIAGNOSIS — I10 Essential (primary) hypertension: Secondary | ICD-10-CM | POA: Diagnosis not present

## 2019-03-02 DIAGNOSIS — E559 Vitamin D deficiency, unspecified: Secondary | ICD-10-CM | POA: Diagnosis not present

## 2019-03-02 DIAGNOSIS — N183 Chronic kidney disease, stage 3 (moderate): Secondary | ICD-10-CM | POA: Diagnosis not present

## 2019-03-04 DIAGNOSIS — I10 Essential (primary) hypertension: Secondary | ICD-10-CM | POA: Diagnosis not present

## 2019-03-04 DIAGNOSIS — N390 Urinary tract infection, site not specified: Secondary | ICD-10-CM | POA: Diagnosis not present

## 2019-03-04 DIAGNOSIS — N183 Chronic kidney disease, stage 3 (moderate): Secondary | ICD-10-CM | POA: Diagnosis not present

## 2019-03-04 DIAGNOSIS — E559 Vitamin D deficiency, unspecified: Secondary | ICD-10-CM | POA: Diagnosis not present

## 2019-03-08 DIAGNOSIS — N183 Chronic kidney disease, stage 3 (moderate): Secondary | ICD-10-CM | POA: Diagnosis not present

## 2019-03-08 DIAGNOSIS — E782 Mixed hyperlipidemia: Secondary | ICD-10-CM | POA: Diagnosis not present

## 2019-03-08 DIAGNOSIS — N032 Chronic nephritic syndrome with diffuse membranous glomerulonephritis: Secondary | ICD-10-CM | POA: Diagnosis not present

## 2019-03-08 DIAGNOSIS — E559 Vitamin D deficiency, unspecified: Secondary | ICD-10-CM | POA: Diagnosis not present

## 2019-03-08 DIAGNOSIS — Z131 Encounter for screening for diabetes mellitus: Secondary | ICD-10-CM | POA: Diagnosis not present

## 2019-03-08 DIAGNOSIS — I10 Essential (primary) hypertension: Secondary | ICD-10-CM | POA: Diagnosis not present

## 2019-03-08 DIAGNOSIS — Z01118 Encounter for examination of ears and hearing with other abnormal findings: Secondary | ICD-10-CM | POA: Diagnosis not present

## 2019-03-08 DIAGNOSIS — L409 Psoriasis, unspecified: Secondary | ICD-10-CM | POA: Diagnosis not present

## 2019-03-08 DIAGNOSIS — I119 Hypertensive heart disease without heart failure: Secondary | ICD-10-CM | POA: Diagnosis not present

## 2019-03-08 DIAGNOSIS — E039 Hypothyroidism, unspecified: Secondary | ICD-10-CM | POA: Diagnosis not present

## 2019-03-08 DIAGNOSIS — Z136 Encounter for screening for cardiovascular disorders: Secondary | ICD-10-CM | POA: Diagnosis not present

## 2019-03-11 DIAGNOSIS — N200 Calculus of kidney: Secondary | ICD-10-CM | POA: Diagnosis not present

## 2019-03-16 DIAGNOSIS — R109 Unspecified abdominal pain: Secondary | ICD-10-CM | POA: Diagnosis not present

## 2019-03-16 DIAGNOSIS — K8689 Other specified diseases of pancreas: Secondary | ICD-10-CM | POA: Diagnosis not present

## 2019-03-16 DIAGNOSIS — N390 Urinary tract infection, site not specified: Secondary | ICD-10-CM | POA: Diagnosis not present

## 2019-03-16 DIAGNOSIS — K219 Gastro-esophageal reflux disease without esophagitis: Secondary | ICD-10-CM | POA: Diagnosis not present

## 2019-03-16 DIAGNOSIS — K8020 Calculus of gallbladder without cholecystitis without obstruction: Secondary | ICD-10-CM | POA: Diagnosis not present

## 2019-03-16 DIAGNOSIS — N2889 Other specified disorders of kidney and ureter: Secondary | ICD-10-CM | POA: Diagnosis not present

## 2019-03-17 DIAGNOSIS — E039 Hypothyroidism, unspecified: Secondary | ICD-10-CM | POA: Diagnosis not present

## 2019-03-17 DIAGNOSIS — N032 Chronic nephritic syndrome with diffuse membranous glomerulonephritis: Secondary | ICD-10-CM | POA: Diagnosis not present

## 2019-03-17 DIAGNOSIS — I10 Essential (primary) hypertension: Secondary | ICD-10-CM | POA: Diagnosis not present

## 2019-03-17 DIAGNOSIS — E782 Mixed hyperlipidemia: Secondary | ICD-10-CM | POA: Diagnosis not present

## 2019-03-17 DIAGNOSIS — E559 Vitamin D deficiency, unspecified: Secondary | ICD-10-CM | POA: Diagnosis not present

## 2019-03-17 DIAGNOSIS — I119 Hypertensive heart disease without heart failure: Secondary | ICD-10-CM | POA: Diagnosis not present

## 2019-03-17 DIAGNOSIS — L409 Psoriasis, unspecified: Secondary | ICD-10-CM | POA: Diagnosis not present

## 2019-03-17 DIAGNOSIS — N183 Chronic kidney disease, stage 3 (moderate): Secondary | ICD-10-CM | POA: Diagnosis not present

## 2019-03-17 DIAGNOSIS — M79671 Pain in right foot: Secondary | ICD-10-CM | POA: Diagnosis not present

## 2019-03-30 DIAGNOSIS — E782 Mixed hyperlipidemia: Secondary | ICD-10-CM | POA: Diagnosis not present

## 2019-03-30 DIAGNOSIS — I119 Hypertensive heart disease without heart failure: Secondary | ICD-10-CM | POA: Diagnosis not present

## 2019-03-30 DIAGNOSIS — L409 Psoriasis, unspecified: Secondary | ICD-10-CM | POA: Diagnosis not present

## 2019-03-30 DIAGNOSIS — N183 Chronic kidney disease, stage 3 (moderate): Secondary | ICD-10-CM | POA: Diagnosis not present

## 2019-03-30 DIAGNOSIS — E559 Vitamin D deficiency, unspecified: Secondary | ICD-10-CM | POA: Diagnosis not present

## 2019-03-30 DIAGNOSIS — I10 Essential (primary) hypertension: Secondary | ICD-10-CM | POA: Diagnosis not present

## 2019-03-30 DIAGNOSIS — E039 Hypothyroidism, unspecified: Secondary | ICD-10-CM | POA: Diagnosis not present

## 2019-03-30 DIAGNOSIS — N032 Chronic nephritic syndrome with diffuse membranous glomerulonephritis: Secondary | ICD-10-CM | POA: Diagnosis not present

## 2019-03-30 DIAGNOSIS — M79671 Pain in right foot: Secondary | ICD-10-CM | POA: Diagnosis not present

## 2019-04-02 DIAGNOSIS — N032 Chronic nephritic syndrome with diffuse membranous glomerulonephritis: Secondary | ICD-10-CM | POA: Diagnosis not present

## 2019-04-02 DIAGNOSIS — L409 Psoriasis, unspecified: Secondary | ICD-10-CM | POA: Diagnosis not present

## 2019-04-02 DIAGNOSIS — N183 Chronic kidney disease, stage 3 (moderate): Secondary | ICD-10-CM | POA: Diagnosis not present

## 2019-04-02 DIAGNOSIS — I119 Hypertensive heart disease without heart failure: Secondary | ICD-10-CM | POA: Diagnosis not present

## 2019-04-02 DIAGNOSIS — E559 Vitamin D deficiency, unspecified: Secondary | ICD-10-CM | POA: Diagnosis not present

## 2019-04-02 DIAGNOSIS — E782 Mixed hyperlipidemia: Secondary | ICD-10-CM | POA: Diagnosis not present

## 2019-04-02 DIAGNOSIS — I10 Essential (primary) hypertension: Secondary | ICD-10-CM | POA: Diagnosis not present

## 2019-04-02 DIAGNOSIS — Z0001 Encounter for general adult medical examination with abnormal findings: Secondary | ICD-10-CM | POA: Diagnosis not present

## 2019-04-02 DIAGNOSIS — E039 Hypothyroidism, unspecified: Secondary | ICD-10-CM | POA: Diagnosis not present

## 2019-04-06 DIAGNOSIS — M722 Plantar fascial fibromatosis: Secondary | ICD-10-CM | POA: Diagnosis not present

## 2019-04-06 DIAGNOSIS — M79671 Pain in right foot: Secondary | ICD-10-CM | POA: Diagnosis not present

## 2019-04-14 DIAGNOSIS — N183 Chronic kidney disease, stage 3 (moderate): Secondary | ICD-10-CM | POA: Diagnosis not present

## 2019-04-14 DIAGNOSIS — R52 Pain, unspecified: Secondary | ICD-10-CM | POA: Diagnosis not present

## 2019-04-14 DIAGNOSIS — N032 Chronic nephritic syndrome with diffuse membranous glomerulonephritis: Secondary | ICD-10-CM | POA: Diagnosis not present

## 2019-04-14 DIAGNOSIS — I119 Hypertensive heart disease without heart failure: Secondary | ICD-10-CM | POA: Diagnosis not present

## 2019-04-14 DIAGNOSIS — E782 Mixed hyperlipidemia: Secondary | ICD-10-CM | POA: Diagnosis not present

## 2019-04-14 DIAGNOSIS — L409 Psoriasis, unspecified: Secondary | ICD-10-CM | POA: Diagnosis not present

## 2019-04-14 DIAGNOSIS — I10 Essential (primary) hypertension: Secondary | ICD-10-CM | POA: Diagnosis not present

## 2019-04-14 DIAGNOSIS — E559 Vitamin D deficiency, unspecified: Secondary | ICD-10-CM | POA: Diagnosis not present

## 2019-04-14 DIAGNOSIS — E039 Hypothyroidism, unspecified: Secondary | ICD-10-CM | POA: Diagnosis not present

## 2019-05-13 DIAGNOSIS — M1712 Unilateral primary osteoarthritis, left knee: Secondary | ICD-10-CM | POA: Diagnosis not present

## 2019-05-13 DIAGNOSIS — Z96651 Presence of right artificial knee joint: Secondary | ICD-10-CM | POA: Diagnosis not present

## 2019-06-10 DIAGNOSIS — N289 Disorder of kidney and ureter, unspecified: Secondary | ICD-10-CM | POA: Diagnosis not present

## 2019-07-19 DIAGNOSIS — E039 Hypothyroidism, unspecified: Secondary | ICD-10-CM | POA: Diagnosis not present

## 2019-07-19 DIAGNOSIS — L409 Psoriasis, unspecified: Secondary | ICD-10-CM | POA: Diagnosis not present

## 2019-07-19 DIAGNOSIS — I119 Hypertensive heart disease without heart failure: Secondary | ICD-10-CM | POA: Diagnosis not present

## 2019-07-19 DIAGNOSIS — N183 Chronic kidney disease, stage 3 (moderate): Secondary | ICD-10-CM | POA: Diagnosis not present

## 2019-07-19 DIAGNOSIS — N032 Chronic nephritic syndrome with diffuse membranous glomerulonephritis: Secondary | ICD-10-CM | POA: Diagnosis not present

## 2019-07-19 DIAGNOSIS — E559 Vitamin D deficiency, unspecified: Secondary | ICD-10-CM | POA: Diagnosis not present

## 2019-07-19 DIAGNOSIS — I10 Essential (primary) hypertension: Secondary | ICD-10-CM | POA: Diagnosis not present

## 2019-07-19 DIAGNOSIS — E782 Mixed hyperlipidemia: Secondary | ICD-10-CM | POA: Diagnosis not present

## 2019-08-05 DIAGNOSIS — L400 Psoriasis vulgaris: Secondary | ICD-10-CM | POA: Diagnosis not present

## 2019-08-09 ENCOUNTER — Other Ambulatory Visit: Payer: Self-pay

## 2019-08-09 DIAGNOSIS — Z20822 Contact with and (suspected) exposure to covid-19: Secondary | ICD-10-CM

## 2019-08-10 LAB — NOVEL CORONAVIRUS, NAA: SARS-CoV-2, NAA: NOT DETECTED

## 2019-08-16 DIAGNOSIS — E782 Mixed hyperlipidemia: Secondary | ICD-10-CM | POA: Diagnosis not present

## 2019-08-16 DIAGNOSIS — E559 Vitamin D deficiency, unspecified: Secondary | ICD-10-CM | POA: Diagnosis not present

## 2019-08-16 DIAGNOSIS — I119 Hypertensive heart disease without heart failure: Secondary | ICD-10-CM | POA: Diagnosis not present

## 2019-08-16 DIAGNOSIS — N032 Chronic nephritic syndrome with diffuse membranous glomerulonephritis: Secondary | ICD-10-CM | POA: Diagnosis not present

## 2019-08-16 DIAGNOSIS — E039 Hypothyroidism, unspecified: Secondary | ICD-10-CM | POA: Diagnosis not present

## 2019-08-16 DIAGNOSIS — M792 Neuralgia and neuritis, unspecified: Secondary | ICD-10-CM | POA: Diagnosis not present

## 2019-08-16 DIAGNOSIS — I10 Essential (primary) hypertension: Secondary | ICD-10-CM | POA: Diagnosis not present

## 2019-08-16 DIAGNOSIS — L409 Psoriasis, unspecified: Secondary | ICD-10-CM | POA: Diagnosis not present

## 2019-08-16 DIAGNOSIS — N183 Chronic kidney disease, stage 3 (moderate): Secondary | ICD-10-CM | POA: Diagnosis not present

## 2019-08-20 ENCOUNTER — Other Ambulatory Visit: Payer: Self-pay

## 2019-08-20 DIAGNOSIS — Z20822 Contact with and (suspected) exposure to covid-19: Secondary | ICD-10-CM

## 2019-08-22 LAB — NOVEL CORONAVIRUS, NAA: SARS-CoV-2, NAA: NOT DETECTED

## 2019-08-23 DIAGNOSIS — L400 Psoriasis vulgaris: Secondary | ICD-10-CM | POA: Diagnosis not present

## 2019-09-02 DIAGNOSIS — M792 Neuralgia and neuritis, unspecified: Secondary | ICD-10-CM | POA: Diagnosis not present

## 2019-09-02 DIAGNOSIS — E782 Mixed hyperlipidemia: Secondary | ICD-10-CM | POA: Diagnosis not present

## 2019-09-02 DIAGNOSIS — N032 Chronic nephritic syndrome with diffuse membranous glomerulonephritis: Secondary | ICD-10-CM | POA: Diagnosis not present

## 2019-09-02 DIAGNOSIS — B029 Zoster without complications: Secondary | ICD-10-CM | POA: Diagnosis not present

## 2019-09-02 DIAGNOSIS — N183 Chronic kidney disease, stage 3 (moderate): Secondary | ICD-10-CM | POA: Diagnosis not present

## 2019-09-02 DIAGNOSIS — I10 Essential (primary) hypertension: Secondary | ICD-10-CM | POA: Diagnosis not present

## 2019-09-02 DIAGNOSIS — E039 Hypothyroidism, unspecified: Secondary | ICD-10-CM | POA: Diagnosis not present

## 2019-09-02 DIAGNOSIS — I119 Hypertensive heart disease without heart failure: Secondary | ICD-10-CM | POA: Diagnosis not present

## 2019-09-02 DIAGNOSIS — L409 Psoriasis, unspecified: Secondary | ICD-10-CM | POA: Diagnosis not present

## 2019-09-02 DIAGNOSIS — E559 Vitamin D deficiency, unspecified: Secondary | ICD-10-CM | POA: Diagnosis not present

## 2019-09-03 DIAGNOSIS — I10 Essential (primary) hypertension: Secondary | ICD-10-CM | POA: Diagnosis not present

## 2019-09-03 DIAGNOSIS — N183 Chronic kidney disease, stage 3 (moderate): Secondary | ICD-10-CM | POA: Diagnosis not present

## 2019-09-03 DIAGNOSIS — D509 Iron deficiency anemia, unspecified: Secondary | ICD-10-CM | POA: Diagnosis not present

## 2019-09-03 DIAGNOSIS — D518 Other vitamin B12 deficiency anemias: Secondary | ICD-10-CM | POA: Diagnosis not present

## 2019-09-03 DIAGNOSIS — D631 Anemia in chronic kidney disease: Secondary | ICD-10-CM | POA: Diagnosis not present

## 2019-09-03 DIAGNOSIS — D649 Anemia, unspecified: Secondary | ICD-10-CM | POA: Diagnosis not present

## 2019-09-03 DIAGNOSIS — I129 Hypertensive chronic kidney disease with stage 1 through stage 4 chronic kidney disease, or unspecified chronic kidney disease: Secondary | ICD-10-CM | POA: Diagnosis not present

## 2019-09-06 DIAGNOSIS — N183 Chronic kidney disease, stage 3 (moderate): Secondary | ICD-10-CM | POA: Diagnosis not present

## 2019-09-06 DIAGNOSIS — N390 Urinary tract infection, site not specified: Secondary | ICD-10-CM | POA: Diagnosis not present

## 2019-09-06 DIAGNOSIS — I10 Essential (primary) hypertension: Secondary | ICD-10-CM | POA: Diagnosis not present

## 2019-09-14 DIAGNOSIS — N289 Disorder of kidney and ureter, unspecified: Secondary | ICD-10-CM | POA: Diagnosis not present

## 2019-09-21 DIAGNOSIS — Z23 Encounter for immunization: Secondary | ICD-10-CM | POA: Diagnosis not present

## 2019-09-21 DIAGNOSIS — K8689 Other specified diseases of pancreas: Secondary | ICD-10-CM | POA: Diagnosis not present

## 2019-09-21 DIAGNOSIS — K219 Gastro-esophageal reflux disease without esophagitis: Secondary | ICD-10-CM | POA: Diagnosis not present

## 2019-09-21 DIAGNOSIS — Z1159 Encounter for screening for other viral diseases: Secondary | ICD-10-CM | POA: Diagnosis not present

## 2019-09-21 DIAGNOSIS — R109 Unspecified abdominal pain: Secondary | ICD-10-CM | POA: Diagnosis not present

## 2019-09-23 DIAGNOSIS — E039 Hypothyroidism, unspecified: Secondary | ICD-10-CM | POA: Diagnosis not present

## 2019-09-23 DIAGNOSIS — L409 Psoriasis, unspecified: Secondary | ICD-10-CM | POA: Diagnosis not present

## 2019-09-23 DIAGNOSIS — E559 Vitamin D deficiency, unspecified: Secondary | ICD-10-CM | POA: Diagnosis not present

## 2019-09-23 DIAGNOSIS — N1832 Chronic kidney disease, stage 3b: Secondary | ICD-10-CM | POA: Diagnosis not present

## 2019-09-23 DIAGNOSIS — I119 Hypertensive heart disease without heart failure: Secondary | ICD-10-CM | POA: Diagnosis not present

## 2019-09-23 DIAGNOSIS — E782 Mixed hyperlipidemia: Secondary | ICD-10-CM | POA: Diagnosis not present

## 2019-09-23 DIAGNOSIS — I10 Essential (primary) hypertension: Secondary | ICD-10-CM | POA: Diagnosis not present

## 2019-09-23 DIAGNOSIS — N032 Chronic nephritic syndrome with diffuse membranous glomerulonephritis: Secondary | ICD-10-CM | POA: Diagnosis not present

## 2019-09-23 DIAGNOSIS — M792 Neuralgia and neuritis, unspecified: Secondary | ICD-10-CM | POA: Diagnosis not present

## 2019-10-01 DIAGNOSIS — Z1239 Encounter for other screening for malignant neoplasm of breast: Secondary | ICD-10-CM | POA: Diagnosis not present

## 2019-10-01 DIAGNOSIS — Z1231 Encounter for screening mammogram for malignant neoplasm of breast: Secondary | ICD-10-CM | POA: Diagnosis not present

## 2019-10-04 DIAGNOSIS — Z7189 Other specified counseling: Secondary | ICD-10-CM | POA: Diagnosis not present

## 2019-10-04 DIAGNOSIS — L400 Psoriasis vulgaris: Secondary | ICD-10-CM | POA: Diagnosis not present

## 2019-12-27 DIAGNOSIS — L400 Psoriasis vulgaris: Secondary | ICD-10-CM | POA: Diagnosis not present

## 2019-12-30 DIAGNOSIS — M792 Neuralgia and neuritis, unspecified: Secondary | ICD-10-CM | POA: Diagnosis not present

## 2019-12-30 DIAGNOSIS — E782 Mixed hyperlipidemia: Secondary | ICD-10-CM | POA: Diagnosis not present

## 2019-12-30 DIAGNOSIS — N1832 Chronic kidney disease, stage 3b: Secondary | ICD-10-CM | POA: Diagnosis not present

## 2019-12-30 DIAGNOSIS — I119 Hypertensive heart disease without heart failure: Secondary | ICD-10-CM | POA: Diagnosis not present

## 2019-12-30 DIAGNOSIS — M25541 Pain in joints of right hand: Secondary | ICD-10-CM | POA: Diagnosis not present

## 2019-12-30 DIAGNOSIS — E559 Vitamin D deficiency, unspecified: Secondary | ICD-10-CM | POA: Diagnosis not present

## 2019-12-30 DIAGNOSIS — I10 Essential (primary) hypertension: Secondary | ICD-10-CM | POA: Diagnosis not present

## 2019-12-30 DIAGNOSIS — L409 Psoriasis, unspecified: Secondary | ICD-10-CM | POA: Diagnosis not present

## 2019-12-30 DIAGNOSIS — E039 Hypothyroidism, unspecified: Secondary | ICD-10-CM | POA: Diagnosis not present

## 2019-12-30 DIAGNOSIS — N032 Chronic nephritic syndrome with diffuse membranous glomerulonephritis: Secondary | ICD-10-CM | POA: Diagnosis not present

## 2020-02-27 DIAGNOSIS — Z6831 Body mass index (BMI) 31.0-31.9, adult: Secondary | ICD-10-CM | POA: Diagnosis not present

## 2020-02-27 DIAGNOSIS — I2510 Atherosclerotic heart disease of native coronary artery without angina pectoris: Secondary | ICD-10-CM | POA: Diagnosis not present

## 2020-02-27 DIAGNOSIS — K219 Gastro-esophageal reflux disease without esophagitis: Secondary | ICD-10-CM | POA: Diagnosis not present

## 2020-02-27 DIAGNOSIS — E785 Hyperlipidemia, unspecified: Secondary | ICD-10-CM | POA: Diagnosis not present

## 2020-02-27 DIAGNOSIS — E039 Hypothyroidism, unspecified: Secondary | ICD-10-CM | POA: Diagnosis not present

## 2020-02-27 DIAGNOSIS — M069 Rheumatoid arthritis, unspecified: Secondary | ICD-10-CM | POA: Diagnosis not present

## 2020-02-27 DIAGNOSIS — Z791 Long term (current) use of non-steroidal anti-inflammatories (NSAID): Secondary | ICD-10-CM | POA: Diagnosis not present

## 2020-02-27 DIAGNOSIS — E669 Obesity, unspecified: Secondary | ICD-10-CM | POA: Diagnosis not present

## 2020-02-27 DIAGNOSIS — I10 Essential (primary) hypertension: Secondary | ICD-10-CM | POA: Diagnosis not present

## 2020-02-27 DIAGNOSIS — G4700 Insomnia, unspecified: Secondary | ICD-10-CM | POA: Diagnosis not present

## 2020-03-06 DIAGNOSIS — N390 Urinary tract infection, site not specified: Secondary | ICD-10-CM | POA: Diagnosis not present

## 2020-03-06 DIAGNOSIS — N182 Chronic kidney disease, stage 2 (mild): Secondary | ICD-10-CM | POA: Diagnosis not present

## 2020-03-06 DIAGNOSIS — N183 Chronic kidney disease, stage 3 unspecified: Secondary | ICD-10-CM | POA: Diagnosis not present

## 2020-03-06 DIAGNOSIS — D631 Anemia in chronic kidney disease: Secondary | ICD-10-CM | POA: Diagnosis not present

## 2020-03-06 DIAGNOSIS — D518 Other vitamin B12 deficiency anemias: Secondary | ICD-10-CM | POA: Diagnosis not present

## 2020-03-06 DIAGNOSIS — I10 Essential (primary) hypertension: Secondary | ICD-10-CM | POA: Diagnosis not present

## 2020-03-06 DIAGNOSIS — D649 Anemia, unspecified: Secondary | ICD-10-CM | POA: Diagnosis not present

## 2020-03-07 DIAGNOSIS — E782 Mixed hyperlipidemia: Secondary | ICD-10-CM | POA: Diagnosis not present

## 2020-03-07 DIAGNOSIS — I10 Essential (primary) hypertension: Secondary | ICD-10-CM | POA: Diagnosis not present

## 2020-03-07 DIAGNOSIS — L409 Psoriasis, unspecified: Secondary | ICD-10-CM | POA: Diagnosis not present

## 2020-03-07 DIAGNOSIS — M792 Neuralgia and neuritis, unspecified: Secondary | ICD-10-CM | POA: Diagnosis not present

## 2020-03-07 DIAGNOSIS — Z0000 Encounter for general adult medical examination without abnormal findings: Secondary | ICD-10-CM | POA: Diagnosis not present

## 2020-03-07 DIAGNOSIS — Z0110 Encounter for examination of ears and hearing without abnormal findings: Secondary | ICD-10-CM | POA: Diagnosis not present

## 2020-03-07 DIAGNOSIS — Z136 Encounter for screening for cardiovascular disorders: Secondary | ICD-10-CM | POA: Diagnosis not present

## 2020-03-07 DIAGNOSIS — Z131 Encounter for screening for diabetes mellitus: Secondary | ICD-10-CM | POA: Diagnosis not present

## 2020-03-07 DIAGNOSIS — N032 Chronic nephritic syndrome with diffuse membranous glomerulonephritis: Secondary | ICD-10-CM | POA: Diagnosis not present

## 2020-03-07 DIAGNOSIS — E039 Hypothyroidism, unspecified: Secondary | ICD-10-CM | POA: Diagnosis not present

## 2020-03-14 DIAGNOSIS — N200 Calculus of kidney: Secondary | ICD-10-CM | POA: Diagnosis not present

## 2020-03-20 DIAGNOSIS — R109 Unspecified abdominal pain: Secondary | ICD-10-CM | POA: Diagnosis not present

## 2020-03-20 DIAGNOSIS — Z20822 Contact with and (suspected) exposure to covid-19: Secondary | ICD-10-CM | POA: Diagnosis not present

## 2020-03-20 DIAGNOSIS — K2970 Gastritis, unspecified, without bleeding: Secondary | ICD-10-CM | POA: Diagnosis not present

## 2020-03-20 DIAGNOSIS — Z79899 Other long term (current) drug therapy: Secondary | ICD-10-CM | POA: Diagnosis not present

## 2020-03-20 DIAGNOSIS — K219 Gastro-esophageal reflux disease without esophagitis: Secondary | ICD-10-CM | POA: Diagnosis not present

## 2020-03-20 DIAGNOSIS — K8689 Other specified diseases of pancreas: Secondary | ICD-10-CM | POA: Diagnosis not present

## 2020-03-20 DIAGNOSIS — K2990 Gastroduodenitis, unspecified, without bleeding: Secondary | ICD-10-CM | POA: Diagnosis not present

## 2020-03-27 DIAGNOSIS — L400 Psoriasis vulgaris: Secondary | ICD-10-CM | POA: Diagnosis not present

## 2020-04-03 ENCOUNTER — Ambulatory Visit: Payer: Medicare HMO | Attending: Physician Assistant | Admitting: Physical Therapy

## 2020-04-03 ENCOUNTER — Encounter: Payer: Self-pay | Admitting: Physical Therapy

## 2020-04-03 ENCOUNTER — Other Ambulatory Visit: Payer: Self-pay

## 2020-04-03 DIAGNOSIS — R29898 Other symptoms and signs involving the musculoskeletal system: Secondary | ICD-10-CM | POA: Insufficient documentation

## 2020-04-03 DIAGNOSIS — M5441 Lumbago with sciatica, right side: Secondary | ICD-10-CM | POA: Diagnosis not present

## 2020-04-03 DIAGNOSIS — R262 Difficulty in walking, not elsewhere classified: Secondary | ICD-10-CM | POA: Insufficient documentation

## 2020-04-03 DIAGNOSIS — G8929 Other chronic pain: Secondary | ICD-10-CM | POA: Insufficient documentation

## 2020-04-03 NOTE — Therapy (Signed)
[facility] [address]  [address] [place], [place], [address] Phone: [phone]   Fax:  [phone]  Physical Therapy Evaluation  Patient Details  Name: Kristina Weber MRN: [id number] Date of Birth: November 09, 1946 Referring Provider (PT): Norva Riffle, [place]   Encounter Date: 04/03/2020  PT End of Session - 04/03/20 1155    Visit Number  1    Number of Visits  7    Date for PT Re-Evaluation  05/15/20    Authorization Type  [employer] Medicare    PT Start Time  1101    PT Stop Time  1147    PT Time Calculation (min)  46 min    Activity Tolerance  Patient limited by pain;Patient tolerated treatment well    Behavior During Therapy  [facility] for tasks assessed/performed       History reviewed. No pertinent past medical history.  History reviewed. No pertinent surgical history.  There were no vitals filed for this visit.   Subjective Assessment - 04/03/20 1105    Subjective  Patient reports fluctuating LBP for the past 3 months, with recent worsening in the past month. Denies inciting trauma. Had R knee replacement about 2 years ago which she feels started giving her muscle spasms in her back. Pain occurs over midline of LBP with B radiation to B sides. Reports N/T in R posterior LE stopping at calf. Denies B&B changes. Worse with bending and heavy lifting, prolonged walking, laying supine, or R sidelying. Feels like a "spasm" and "tight."    Pertinent History  hypothyroidism, psoriasis, CKD III, PVD, HTN, R TKA    Limitations  Lifting;Standing;Walking;House hold activities    How long can you sit comfortably?  varies    How long can you stand comfortably?  1 hour    How long can you walk comfortably?  1/2 mile    Diagnostic tests  lumbar xray 03/01/20: narrowing of disc spaces of L3-4 and L5-S1 and mild spondylosis    Patient Stated Goals  "be able to do more walking"    Currently in Pain?  Yes    Pain Score  5     Pain Location   Back    Pain Orientation  Right;Left    Pain Descriptors / Indicators  Discomfort    Pain Type  Acute pain;Chronic pain         [facility] PT Assessment - 04/03/20 1114      Assessment   Medical Diagnosis  Lumbar DDD    Referring Provider (PT)  Norva Riffle, PA    Onset Date/Surgical Date  01/04/20    Prior Therapy  yes- R TKA      Balance Screen   Has the patient fallen in the past 6 months  No    Has the patient had a decrease in activity level because of a fear of falling?   No    Is the patient reluctant to leave their home because of a fear of falling?   No      Home Nurse, mental health  Private residence    Living Arrangements  Children    Available Help at Discharge  Family;Friend(s)    Type of Home  House    Home Access  Stairs to enter    Entrance Stairs-Number of Steps  3    Entrance Stairs-Rails  Left    Home Layout  One level      Prior Function   Level  of Independence  Independent    Vocation  Retired    Leisure  gym      Cognition   Overall Cognitive Status  Within Functional Limits for tasks assessed      Observation/Other Assessments   Focus on Therapeutic Outcomes (FOTO)   Lumbar: 50 (50% limited, 36% predicted)      Sensation   Light Touch  Appears Intact      Coordination   Gross Motor Movements are Fluid and Coordinated  Yes      Posture/Postural Control   Posture/Postural Control  Postural limitations    Postural Limitations  Rounded Shoulders    Posture Comments  R shoulder depressed      ROM / Strength   AROM / PROM / Strength  AROM;Strength      AROM   AROM Assessment Site  Lumbar    Lumbar Flexion  distal shin   mild discomfort in LB   Lumbar Extension  mildly limited   moderate discomfort   Lumbar - Right Side Bend  jt line    Lumbar - Left Side Bend  distal thigh   moderate discomfort   Lumbar - Right Rotation  WFL   mod-severe discomfort   Lumbar - Left Rotation  WFL   mod-severe discomfort     Strength    Strength Assessment Site  Hip;Knee;Ankle    Right/Left Hip  Right;Left    Right Hip Flexion  4/5    Right Hip ABduction  4+/5    Right Hip ADduction  4/5    Left Hip Flexion  5/5    Left Hip ABduction  4+/5    Left Hip ADduction  4/5    Right/Left Knee  Right;Left    Right Knee Flexion  4+/5    Right Knee Extension  5/5    Left Knee Flexion  5/5    Left Knee Extension  5/5    Right/Left Ankle  Right;Left    Right Ankle Dorsiflexion  4+/5    Right Ankle Plantar Flexion  4+/5    Left Ankle Dorsiflexion  4+/5    Left Ankle Plantar Flexion  4+/5      Flexibility   Soft Tissue Assessment /Muscle Length  yes    Hamstrings  B WNL    Quadriceps  B moderately tight in mod thomas; B LBP    Piriformis  mildly tight B LEs; c/o LBP with R stretching      Palpation   Palpation comment  TTP over L QL and midline of L4 spinous process      Ambulation/Gait   Assistive device  None    Gait Pattern  Step-through pattern;Lateral hip instability;Lateral trunk lean to right;Lateral trunk lean to left;Trunk flexed    Ambulation Surface  Level;Indoor    Gait velocity  decreased                Objective measurements completed on examination: See above findings.              PT Education - 04/03/20 1155    Education Details  prognosis, POC, HEP    Person(s) Educated  Patient    Methods  Explanation;Demonstration;Tactile cues;Handout;Verbal cues    Comprehension  Verbalized understanding;Returned demonstration       PT Short Term Goals - 04/03/20 1210      PT SHORT TERM GOAL #1   Title  Patient to be independent with initial HEP.    Time  3  Period  Weeks    Status  New    Target Date  04/24/20        PT Long Term Goals - 04/03/20 1210      PT LONG TERM GOAL #1   Title  Patient to be independent with advanced HEP.    Time  6    Period  Weeks    Status  New    Target Date  05/15/20      PT LONG TERM GOAL #2   Title  Patient to demonstrate lumbar AROM WFL  and without pain limiting.    Time  6    Period  Weeks    Status  New    Target Date  05/15/20      PT LONG TERM GOAL #3   Title  Patient to return to walking/gym regimen with modifications as needed.    Time  6    Period  Weeks    Status  New    Target Date  05/15/20      PT LONG TERM GOAL #4   Title  Patient to demonstrate safe lifting mechanics when lifitng 20lb box from floor.    Time  6    Period  Weeks    Status  New    Target Date  05/15/20      PT LONG TERM GOAL #5   Title  Patient to report tolerance of walking 1.5 miles without pain limiting.    Time  6    Period  Weeks    Status  New    Target Date  05/15/20             Plan - 04/03/20 1205    Clinical Impression Statement  Patient is a 74y/o F presenting to OPPT with c/o insidious LBP of 3 months duration. Pain occurs over midline of LBP with radiation to B sides. Endorses N/T in R posterior LE stopping at calf. Denies B&B changes. Worse with bending and heavy lifting, prolonged walking, laying supine, or R sidelying. Patient would like to return to walking and fitness regimen at the gym, which she is unable to do d/t pain at this time. Patient today presenting with rounded shoulders and L shoulder depressed, limited and uncomfortable lumbar AROM, B hip flexor weakness, discomfort with hip flexor and piriformis stretching, TTP over L QL and midline of L4 spinous process, and gait deviations. Patient educated on gentle stretching and strengthening HEP. Patient reported understanding. Would benefit from skilled PT services 1x/week for 6 weeks to address aforementioned impairments.    Personal Factors and Comorbidities  Age;Comorbidity 3+;Fitness;Past/Current Experience;Time since onset of injury/illness/exacerbation    Comorbidities  hypothyroidism, psoriasis, CKD III, PVD, HTN, R TKA    Examination-Activity Limitations  Bed Mobility;Sleep;Bend;Squat;Stairs;Stand;Carry;Lift;Locomotion Level;Reach Overhead     Examination-Participation Restrictions  Church;Cleaning;Shop;Community Activity;Yard Work;Laundry;Meal Prep    Stability/Clinical Decision Making  Stable/Uncomplicated    Clinical Decision Making  Low    Rehab Potential  Good    PT Frequency  1x / week    PT Duration  6 weeks    PT Treatment/Interventions  ADLs/Self Care Home Management;Electrical Stimulation;Cryotherapy;Moist Heat;Traction;Therapeutic exercise;Therapeutic activities;Functional mobility training;Stair training;Gait training;Ultrasound;Neuromuscular re-education;Patient/family education;Manual techniques;Taping;Energy conservation;Dry needling;Passive range of motion    PT Next Visit Plan  reassess HEP; lumbopelvic ROM and hip strengthening    Consulted and Agree with Plan of Care  Patient       Patient will benefit from skilled therapeutic intervention in order to improve the following  deficits and impairments:  Decreased activity tolerance, Decreased strength, Pain, Difficulty walking, Increased muscle spasms, Improper body mechanics, Decreased range of motion, Postural dysfunction, Impaired flexibility  Visit Diagnosis: Chronic midline low back pain with right-sided sciatica  Difficulty in walking, not elsewhere classified  Other symptoms and signs involving the musculoskeletal system     Problem List There are no problems to display for this patient.    Anette Guarneri, PT, DPT 04/03/20 12:13 PM   [facility] [address]  [address] [place], [place], [address] Phone: [phone]   Fax:  [phone]  Name: Arcadia Gorgas MRN: [id number] Date of Birth: [birth year]

## 2020-04-05 DIAGNOSIS — Z0000 Encounter for general adult medical examination without abnormal findings: Secondary | ICD-10-CM | POA: Diagnosis not present

## 2020-04-05 DIAGNOSIS — N1831 Chronic kidney disease, stage 3a: Secondary | ICD-10-CM | POA: Diagnosis not present

## 2020-04-10 ENCOUNTER — Ambulatory Visit: Payer: Medicare HMO

## 2020-04-10 ENCOUNTER — Other Ambulatory Visit: Payer: Self-pay

## 2020-04-10 DIAGNOSIS — G8929 Other chronic pain: Secondary | ICD-10-CM

## 2020-04-10 DIAGNOSIS — M5441 Lumbago with sciatica, right side: Secondary | ICD-10-CM | POA: Diagnosis not present

## 2020-04-10 DIAGNOSIS — R262 Difficulty in walking, not elsewhere classified: Secondary | ICD-10-CM

## 2020-04-10 DIAGNOSIS — R29898 Other symptoms and signs involving the musculoskeletal system: Secondary | ICD-10-CM

## 2020-04-10 NOTE — Therapy (Signed)
[facility] [address]  [place], [place], [address] Phone: [phone]   Fax:  [phone]  Physical Therapy Treatment  Patient Details  Name: Kristina Weber MRN: [id number] Date of Birth: [birth year] Referring Provider (PT): Raelyn Number, [place]   Encounter Date: 04/10/2020  PT End of Session - 04/10/20 0934    Visit Number  2    Number of Visits  7    Date for PT Re-Evaluation  05/15/20    Authorization Type  A[employer]na Medicare    PT Start Time  [phone]    PT Stop Time  1006    PT Time Calculation (min)  39 min    Activity Tolerance  Patient limited by pain;Patient tolerated treatment well    Behavior During Therapy  [facility] for tasks assessed/performed       No past medical history on file.  No past surgical history on file.  There were no vitals filed for this visit.  Subjective Assessment - 04/10/20 0932    Subjective  Pt. reporting some issues with glute stretch with some numbness in R leg.  Denies pain currently.    Pertinent History  hypothyroidism, psoriasis, CKD III, PVD, HTN, R TKA    Diagnostic tests  lumbar xray 03/01/20: narrowing of disc spaces of L3-4 and L5-S1 and mild spondylosis    Patient Stated Goals  "be able to do more walking"    Currently in Pain?  No/denies    Pain Score  0-No pain   pain up to 6/10 at times   Pain Location  Back    Pain Orientation  Right;Left    Pain Descriptors / Indicators  Discomfort    Pain Type  Acute pain;Chronic pain    Multiple Pain Sites  No                       [facility] Adult PT Treatment/Exercise - 04/10/20 0001      Lumbar Exercises: Stretches   Single Knee to Chest Stretch  Right;Left;1 rep;30 seconds    Single Knee to Chest Stretch Limitations  B    Lower Trunk Rotation Limitations  5" x 10 rep s    Hip Flexor Stretch  Right;Left;1 rep;30 seconds    Hip Flexor Stretch Limitations  mod thomas + strap     Piriformis Stretch  Right;Left;1  rep;30 seconds    Piriformis Stretch Limitations  KTOS      Lumbar Exercises: Aerobic   Nustep  Lvl 3, 6 min       Lumbar Exercises: Standing   Wall Slides  10 reps;3 seconds    Wall Slides Limitations  + adduction ball squeeze       Lumbar Exercises: Supine   Bridge  10 reps;3 seconds    Bridge Limitations  cues to avoid full hip ext due to HS cramping       Knee/Hip Exercises: Standing   Hip Abduction  Right;Left;10 reps;Knee straight;Stengthening    Abduction Limitations  counter                PT Short Term Goals - 04/10/20 0937      PT SHORT TERM GOAL #1   Title  Patient to be independent with initial HEP.    Time  3    Period  Weeks    Status  Achieved    Target Date  04/24/20        PT Long Term  Goals - 04/10/20 0937      PT LONG TERM GOAL #1   Title  Patient to be independent with advanced HEP.    Time  6    Period  Weeks    Status  On-going      PT LONG TERM GOAL #2   Title  Patient to demonstrate lumbar AROM WFL and without pain limiting.    Time  6    Period  Weeks    Status  On-going      PT LONG TERM GOAL #3   Title  Patient to return to walking/gym regimen with modifications as needed.    Time  6    Period  Weeks    Status  On-going      PT LONG TERM GOAL #4   Title  Patient to demonstrate safe lifting mechanics when lifitng 20lb box from floor.    Time  6    Period  Weeks    Status  On-going      PT LONG TERM GOAL #5   Title  Patient to report tolerance of walking 1.5 miles without pain limiting.    Time  6    Period  Weeks    Status  On-going            Plan - 04/10/20 0300    Clinical Impression Statement  Patient noting some LBP with LE stretching from HEP.  Pt. able to perform HEP without increased pain after cueing to reduce her UE pressure with stretch.  Tolerated progression of hip/LE strengthening activities in session without increased pain.  Did require cueing with standing hip abduction to avoid QL  activation/LBP.  Ended session with pt. denying pain thus modalities deferred.  STG #1 met.    Comorbidities  hypothyroidism, psoriasis, CKD III, PVD, HTN, R TKA    Rehab Potential  Good    PT Frequency  1x / week    PT Treatment/Interventions  ADLs/Self Care Home Management;Electrical Stimulation;Cryotherapy;Moist Heat;Traction;Therapeutic exercise;Therapeutic activities;Functional mobility training;Stair training;Gait training;Ultrasound;Neuromuscular re-education;Patient/family education;Manual techniques;Taping;Energy conservation;Dry needling;Passive range of motion    PT Next Visit Plan  Lumbopelvic ROM and hip strengthening    Consulted and Agree with Plan of Care  Patient       Patient will benefit from skilled therapeutic intervention in order to improve the following deficits and impairments:  Decreased activity tolerance, Decreased strength, Pain, Difficulty walking, Increased muscle spasms, Improper body mechanics, Decreased range of motion, Postural dysfunction, Impaired flexibility  Visit Diagnosis: Chronic midline low back pain with right-sided sciatica  Difficulty in walking, not elsewhere classified  Other symptoms and signs involving the musculoskeletal system     Problem List There are no problems to display for this patient.   Bess Harvest, PTA 04/10/20 10:47 AM   [facility] [address]  [place], [place], [address] Phone: [phone]   Fax:  [phone]  Name: Zanita Millman MRN: [id number] Date of Birth: 1945-12-24

## 2020-04-17 ENCOUNTER — Ambulatory Visit: Payer: Medicare HMO | Admitting: Physical Therapy

## 2020-04-17 ENCOUNTER — Encounter: Payer: Self-pay | Admitting: Physical Therapy

## 2020-04-17 ENCOUNTER — Other Ambulatory Visit: Payer: Self-pay

## 2020-04-17 DIAGNOSIS — G8929 Other chronic pain: Secondary | ICD-10-CM

## 2020-04-17 DIAGNOSIS — R262 Difficulty in walking, not elsewhere classified: Secondary | ICD-10-CM

## 2020-04-17 DIAGNOSIS — M5441 Lumbago with sciatica, right side: Secondary | ICD-10-CM | POA: Diagnosis not present

## 2020-04-17 DIAGNOSIS — R29898 Other symptoms and signs involving the musculoskeletal system: Secondary | ICD-10-CM

## 2020-04-17 NOTE — Therapy (Signed)
[facility] [address]  [place], [place], [address] Phone: [phone]   Fax:  [phone]  Physical Therapy Treatment  Patient Details  Name: Kristina Weber MRN: [id number] Date of Birth: 03/16/1946 Referring Provider (PT): Raelyn Number, [place]   Encounter Date: 04/17/2020  PT End of Session - 04/17/20 1011    Visit Number  3    Number of Visits  7    Date for PT Re-Evaluation  05/15/20    Authorization Type  [employer]na Medicare    PT Start Time  [phone]    PT Stop Time  1011    PT Time Calculation (min)  38 min    Activity Tolerance  Patient tolerated treatment well    Behavior During Therapy  [facility] for tasks assessed/performed       History reviewed. No pertinent past medical history.  History reviewed. No pertinent surgical history.  There were no vitals filed for this visit.  Subjective Assessment - 04/17/20 0934    Subjective  HEP is going well and is "getting better with it."    Diagnostic tests  lumbar xray 03/01/20: narrowing of disc spaces of L3-4 and L5-S1 and mild spondylosis    Patient Stated Goals  "be able to do more walking"    Currently in Pain?  No/denies                       [facility] Adult PT Treatment/Exercise - 04/17/20 0001      Exercises   Exercises  Lumbar;Knee/Hip      Lumbar Exercises: Stretches   Single Knee to Chest Stretch  Right;Left;1 rep;30 seconds    Single Knee to Chest Stretch Limitations  B    Hip Flexor Stretch  Right;Left;2 reps;30 seconds    Hip Flexor Stretch Limitations  mod thomas + strap    cueing to maintain knee down     Lumbar Exercises: Aerobic   Recumbent Bike  L1 x 6 min      Lumbar Exercises: Supine   Bridge  10 reps;3 seconds    Bridge Limitations  cues for set up and proper LE positioning     Bridge with clamshell  10 reps   red TB above knees     Lumbar Exercises: Sidelying   Hip Abduction  Right;Left;10 reps    Hip Abduction  Limitations  cues for alignment and TKE   increased difficulty on R   Other Sidelying Lumbar Exercises  R/L hip adduction with opposite LE resting on bolster x10    Other Sidelying Lumbar Exercises  open book stretch R/L x10 each   cues to perform to tolerance     Knee/Hip Exercises: Stretches   Other Knee/Hip Stretches  R/L modified ER stretch 30" each with pillow under knee             PT Education - 04/17/20 1011    Education Details  update to HEP    Person(s) Educated  Patient    Methods  Explanation;Demonstration;Tactile cues;Verbal cues;Handout    Comprehension  Verbalized understanding;Returned demonstration       PT Short Term Goals - 04/10/20 0937      PT SHORT TERM GOAL #1   Title  Patient to be independent with initial HEP.    Time  3    Period  Weeks    Status  Achieved    Target Date  04/24/20  PT Long Term Goals - 04/10/20 8338      PT LONG TERM GOAL #1   Title  Patient to be independent with advanced HEP.    Time  6    Period  Weeks    Status  On-going      PT LONG TERM GOAL #2   Title  Patient to demonstrate lumbar AROM WFL and without pain limiting.    Time  6    Period  Weeks    Status  On-going      PT LONG TERM GOAL #3   Title  Patient to return to walking/gym regimen with modifications as needed.    Time  6    Period  Weeks    Status  On-going      PT LONG TERM GOAL #4   Title  Patient to demonstrate safe lifting mechanics when lifitng 20lb box from floor.    Time  6    Period  Weeks    Status  On-going      PT LONG TERM GOAL #5   Title  Patient to report tolerance of walking 1.5 miles without pain limiting.    Time  6    Period  Weeks    Status  On-going            Plan - 04/17/20 1012    Clinical Impression Statement  Patient without new complaints today. Progressed hip strengthening with addition of resistance with bridges. Patient tolerated this exercise well despite demonstrating limited hip extension d/t  weakness and hip flexor tightness. Addressed flexibility with hip flexor stretching with cueing for proper positioning for max benefit. Patient demonstrated good ROM with open book stretch, however limited by pain rather than stiffness. Sidelying hip strengthening visibly more difficult on R vs. L today. Despite LBP with piriformis stretching on eval, patient was able to tolerate modified hip ER stretch without pain today. Updated stretches that were well-tolerated today into HEP- patient reported understanding and without complaints at end of session. Patient progressing well.    Comorbidities  hypothyroidism, psoriasis, CKD III, PVD, HTN, R TKA    Rehab Potential  Good    PT Frequency  1x / week    PT Treatment/Interventions  ADLs/Self Care Home Management;Electrical Stimulation;Cryotherapy;Moist Heat;Traction;Therapeutic exercise;Therapeutic activities;Functional mobility training;Stair training;Gait training;Ultrasound;Neuromuscular re-education;Patient/family education;Manual techniques;Taping;Energy conservation;Dry needling;Passive range of motion    PT Next Visit Plan  Lumbopelvic ROM and hip strengthening    Consulted and Agree with Plan of Care  Patient       Patient will benefit from skilled therapeutic intervention in order to improve the following deficits and impairments:  Decreased activity tolerance, Decreased strength, Pain, Difficulty walking, Increased muscle spasms, Improper body mechanics, Decreased range of motion, Postural dysfunction, Impaired flexibility  Visit Diagnosis: Chronic midline low back pain with right-sided sciatica  Difficulty in walking, not elsewhere classified  Other symptoms and signs involving the musculoskeletal system     Problem List There are no problems to display for this patient.    Anette Guarneri, PT, DPT 04/17/20 10:14 AM    [facility] [address]  [address] [place], [place], [address] Phone: [phone]   Fax:  [phone]  Name: Kristina Weber MRN: [id number] Date of Birth: [birth year]

## 2020-04-24 ENCOUNTER — Ambulatory Visit: Payer: Medicare HMO | Attending: Physician Assistant

## 2020-04-24 ENCOUNTER — Other Ambulatory Visit: Payer: Self-pay

## 2020-04-24 DIAGNOSIS — R29898 Other symptoms and signs involving the musculoskeletal system: Secondary | ICD-10-CM | POA: Insufficient documentation

## 2020-04-24 DIAGNOSIS — G8929 Other chronic pain: Secondary | ICD-10-CM | POA: Diagnosis not present

## 2020-04-24 DIAGNOSIS — M5441 Lumbago with sciatica, right side: Secondary | ICD-10-CM | POA: Insufficient documentation

## 2020-04-24 DIAGNOSIS — R262 Difficulty in walking, not elsewhere classified: Secondary | ICD-10-CM | POA: Insufficient documentation

## 2020-04-24 NOTE — Therapy (Signed)
[facility] [address]  [place], [place], [address] Phone: [phone]   Fax:  [phone]  Physical Therapy Treatment  Patient Details  Name: Kristina Weber MRN: [id number] Date of Birth: 02-15-1946 Referring Provider (PT): Raelyn Number, [place]   Encounter Date: 04/24/2020  PT End of Session - 04/24/20 7564    Visit Number  4    Number of Visits  7    Date for PT Re-Evaluation  05/15/20    Authorization Type  [employer] Medicare    PT Start Time  [phone]    PT Stop Time  1017    PT Time Calculation (min)  46 min    Activity Tolerance  Patient tolerated treatment well    Behavior During Therapy  [facility] for tasks assessed/performed       No past medical history on file.  No past surgical history on file.  There were no vitals filed for this visit.  Subjective Assessment - 04/24/20 0935    Subjective  Doing well.  Having some L knee pain this morning.    Pertinent History  hypothyroidism, psoriasis, CKD III, PVD, HTN, R TKA    Diagnostic tests  lumbar xray 03/01/20: narrowing of disc spaces of L3-4 and L5-S1 and mild spondylosis    Patient Stated Goals  "be able to do more walking"    Currently in Pain?  No/denies    Pain Score  0-No pain    Pain Location  Back    Pain Orientation  Right;Left    Pain Descriptors / Indicators  Discomfort    Pain Score  4    Pain Location  Knee    Pain Orientation  Left;Medial    Pain Descriptors / Indicators  Crushing    Pain Type  Chronic pain    Aggravating Factors   Unsure                       [facility] Adult PT Treatment/Exercise - 04/24/20 0001      Self-Care   Self-Care  Other Self-Care Comments    Other Self-Care Comments   reviewed proper lifting technique with 20# wooden box from floor to mat table to simulate picking up large (40 count) case of water at [facility] and putting into shopping cart; pt. with good hip/knee flexion however excessive lumbar flexion  picking up box; required cueing for neutral spinal positioning       Lumbar Exercises: Stretches   Passive Hamstring Stretch  Right;Left;1 rep;30 seconds    Passive Hamstring Stretch Limitations  2 x on R (visible increased tightness); supine with strap     Single Knee to Chest Stretch  Right;Left;1 rep;30 seconds    Single Knee to Chest Stretch Limitations  B    Lower Trunk Rotation Limitations  5" x 10 reps      Lumbar Exercises: Aerobic   Recumbent Bike  L1 x 6 min      Knee/Hip Exercises: Standing   Hip Flexion  Right;Left;10 reps;Knee straight;Stengthening    Hip Flexion Limitations  yellow TB looped at ankles; counter     Hip Abduction  Right;Left;10 reps;Knee straight;Stengthening    Abduction Limitations  yellow TB looped at ankle; counter     Hip Extension  Right;Left;10 reps;Knee straight    Extension Limitations  yellow TB looped at ankle; counter                PT Short Term  Goals - 04/10/20 0937      PT SHORT TERM GOAL #1   Title  Patient to be independent with initial HEP.    Time  3    Period  Weeks    Status  Achieved    Target Date  04/24/20        PT Long Term Goals - 04/24/20 1012      PT LONG TERM GOAL #1   Title  Patient to be independent with advanced HEP.    Time  6    Period  Weeks    Status  On-going      PT LONG TERM GOAL #2   Title  Patient to demonstrate lumbar AROM WFL and without pain limiting.    Time  6    Period  Weeks    Status  On-going      PT LONG TERM GOAL #3   Title  Patient to return to walking/gym regimen with modifications as needed.    Time  6    Period  Weeks    Status  On-going      PT LONG TERM GOAL #4   Title  Patient to demonstrate safe lifting mechanics when lifitng 20lb box from floor.    Time  6    Period  Weeks    Status  Partially Met   04/24/20:  good hip/knee flexion, limited awareness of neutral spine     PT LONG TERM GOAL #5   Title  Patient to report tolerance of walking 1.5 miles without  pain limiting.    Time  6    Period  Weeks    Status  On-going            Plan - 04/24/20 0943    Clinical Impression Statement  Pt. noting she is able to bend over longer and with improved ROM with less pain while pulling weeds in her yard.  Notes 60% overall reduction in pain since starting therapy.  Lifting training using 20# box lift from floor to mat table with cueing for neutral spinal alignment for carryover picking up case of water (40 count) at [facility] and placing in shopping car.  Pt. with good hip/knee flexion with 20# box pickup from floor and able to partially achieve LTG #4.  Notes she is still hesitant to return to regular gym attendance at [employer] for cardiorespiratory training however does walk loops frequently in neighborhood ~ 1 mile.  Notes she has not had significant LBP in a few weeks and was able to progress hip strengthening in standing today without pain.  Will continue to discuss possible return to previous gym routine per pt. comfort level over next few visits for improved community fitness.    Comorbidities  hypothyroidism, psoriasis, CKD III, PVD, HTN, R TKA    Rehab Potential  Good    PT Frequency  1x / week    PT Treatment/Interventions  ADLs/Self Care Home Management;Electrical Stimulation;Cryotherapy;Moist Heat;Traction;Therapeutic exercise;Therapeutic activities;Functional mobility training;Stair training;Gait training;Ultrasound;Neuromuscular re-education;Patient/family education;Manual techniques;Taping;Energy conservation;Dry needling;Passive range of motion    PT Next Visit Plan  Lumbopelvic ROM and hip strengthening    Consulted and Agree with Plan of Care  Patient       Patient will benefit from skilled therapeutic intervention in order to improve the following deficits and impairments:  Decreased activity tolerance, Decreased strength, Pain, Difficulty walking, Increased muscle spasms, Improper body mechanics, Decreased range of motion, Postural  dysfunction, Impaired flexibility  Visit Diagnosis: Chronic midline  low back pain with right-sided sciatica  Difficulty in walking, not elsewhere classified  Other symptoms and signs involving the musculoskeletal system     Problem List There are no problems to display for this patient.   Bess Harvest, PTA 04/24/20 10:35 AM   [facility] [address]  [place], [place], [address] Phone: [phone]   Fax:  [phone]  Name: Kristina Weber MRN: [id number] Date of Birth: 1946-05-03

## 2020-05-01 ENCOUNTER — Ambulatory Visit: Payer: Medicare HMO

## 2020-05-01 ENCOUNTER — Other Ambulatory Visit: Payer: Self-pay

## 2020-05-01 DIAGNOSIS — M5441 Lumbago with sciatica, right side: Secondary | ICD-10-CM

## 2020-05-01 DIAGNOSIS — R262 Difficulty in walking, not elsewhere classified: Secondary | ICD-10-CM | POA: Diagnosis not present

## 2020-05-01 DIAGNOSIS — G8929 Other chronic pain: Secondary | ICD-10-CM

## 2020-05-01 DIAGNOSIS — R29898 Other symptoms and signs involving the musculoskeletal system: Secondary | ICD-10-CM | POA: Diagnosis not present

## 2020-05-01 NOTE — Therapy (Signed)
[facility] [address]  [place], [place], [address] Phone: [phone]   Fax:  [phone]  Physical Therapy Treatment  Patient Details  Name: Kristina Weber MRN: [id number] Date of Birth: [birth year] Referring Provider (PT): Raelyn Number, [place]   Encounter Date: 05/01/2020  PT End of Session - 05/01/20 0950    Visit Number  5    Number of Visits  7    Date for PT Re-Evaluation  05/15/20    Authorization Type  [employer]na Medicare    PT Start Time  [phone]    PT Stop Time  1012    PT Time Calculation (min)  38 min    Activity Tolerance  Patient tolerated treatment well    Behavior During Therapy  [facility] for tasks assessed/performed       No past medical history on file.  No past surgical history on file.  There were no vitals filed for this visit.  Subjective Assessment - 05/01/20 0944    Subjective  Pt. noting she has been doing HEP.    Pertinent History  hypothyroidism, psoriasis, CKD III, PVD, HTN, R TKA    Diagnostic tests  lumbar xray 03/01/20: narrowing of disc spaces of L3-4 and L5-S1 and mild spondylosis    Patient Stated Goals  "be able to do more walking"    Currently in Pain?  Yes    Pain Score  0-No pain    Pain Location  Back    Pain Orientation  Right;Left    Multiple Pain Sites  Yes    Pain Score  3    Pain Location  Knee    Pain Orientation  Left;Medial    Pain Descriptors / Indicators  --   " pulling "   Pain Type  Chronic pain    Aggravating Factors   moving it                       [facility] Adult PT Treatment/Exercise - 05/01/20 0001      Lumbar Exercises: Aerobic   Recumbent Bike  L1 x 7 min      Lumbar Exercises: Supine   Bent Knee Raise  15 reps;3 seconds    Bent Knee Raise Limitations  + brace march     Bridge with Ball Squeeze  10 reps;3 seconds    Bridge with [employer] Limitations  adduction ball squeeze     Other Supine Lumbar Exercises  Hooklying adduction  ball squeeze 5 'x 10 reps    + abdom. bracing      Knee/Hip Exercises: Standing   Hip Flexion  Right;Left;10 reps;Knee straight;Stengthening    Hip Flexion Limitations  yellow TB looped at ankles; counter     Hip Abduction  Right;Left;10 reps;Knee straight;Stengthening    Abduction Limitations  yellow TB looped at ankle; counter     Hip Extension  Right;Left;10 reps;Knee straight    Extension Limitations  yellow TB looped at ankle; counter     Functional Squat  10 reps;3 seconds    Functional Squat Limitations  at TM              PT Education - 05/01/20 1303    Education Details  HEP update;  3-way standing hip kicker with yellow looped TB at ankles    Methods  Explanation;Demonstration;Verbal cues;Handout    Comprehension  Verbalized understanding;Returned demonstration;Verbal cues required       PT Short Term Goals -  04/10/20 0937      PT SHORT TERM GOAL #1   Title  Patient to be independent with initial HEP.    Time  3    Period  Weeks    Status  Achieved    Target Date  04/24/20        PT Long Term Goals - 05/01/20 1001      PT LONG TERM GOAL #1   Title  Patient to be independent with advanced HEP.    Time  6    Period  Weeks    Status  Partially Met      PT LONG TERM GOAL #2   Title  Patient to demonstrate lumbar AROM WFL and without pain limiting.    Time  6    Period  Weeks    Status  On-going      PT LONG TERM GOAL #3   Title  Patient to return to walking/gym regimen with modifications as needed.    Time  6    Period  Weeks    Status  On-going      PT LONG TERM GOAL #4   Title  Patient to demonstrate safe lifting mechanics when lifitng 20lb box from floor.    Time  6    Period  Weeks    Status  Partially Met   04/24/20:  good hip/knee flexion, limited awareness of neutral spine     PT LONG TERM GOAL #5   Title  Patient to report tolerance of walking 1.5 miles without pain limiting.    Time  6    Period  Weeks    Status  On-going             Plan - 05/01/20 1304    Clinical Impression Statement  Doing well with no new complaints.  Notes some intermittent knee pain "when I use it" which did not limit therex today.  Session focused on supine and standing lumbopelvic strengthening activities with cueing for core activation/awareness.  Pt. tolerated continued standing 3-way hip kicker with yellow TB resistance well thus updated HEP with these activities. Will plan to monitor tolerance to updated HEP and continue lumbopelvic strengthening activities as pt. tolerates.    Comorbidities  hypothyroidism, psoriasis, CKD III, PVD, HTN, R TKA    Rehab Potential  Good    PT Frequency  1x / week    PT Treatment/Interventions  ADLs/Self Care Home Management;Electrical Stimulation;Cryotherapy;Moist Heat;Traction;Therapeutic exercise;Therapeutic activities;Functional mobility training;Stair training;Gait training;Ultrasound;Neuromuscular re-education;Patient/family education;Manual techniques;Taping;Energy conservation;Dry needling;Passive range of motion    PT Next Visit Plan  Lumbopelvic ROM and hip strengthening    Consulted and Agree with Plan of Care  Patient       Patient will benefit from skilled therapeutic intervention in order to improve the following deficits and impairments:  Decreased activity tolerance, Decreased strength, Pain, Difficulty walking, Increased muscle spasms, Improper body mechanics, Decreased range of motion, Postural dysfunction, Impaired flexibility  Visit Diagnosis: Chronic midline low back pain with right-sided sciatica  Difficulty in walking, not elsewhere classified  Other symptoms and signs involving the musculoskeletal system     Problem List There are no problems to display for this patient.   Bess Harvest, PTA 05/01/20 1:07 PM   [facility] [address]  [place], [place], [address] Phone: [phone]   Fax:   [phone]  Name: Kristina Weber MRN: [id number] Date of Birth: [birth year]

## 2020-05-08 ENCOUNTER — Other Ambulatory Visit: Payer: Self-pay

## 2020-05-08 ENCOUNTER — Ambulatory Visit: Payer: Medicare HMO

## 2020-05-08 DIAGNOSIS — R29898 Other symptoms and signs involving the musculoskeletal system: Secondary | ICD-10-CM | POA: Diagnosis not present

## 2020-05-08 DIAGNOSIS — M5441 Lumbago with sciatica, right side: Secondary | ICD-10-CM

## 2020-05-08 DIAGNOSIS — R262 Difficulty in walking, not elsewhere classified: Secondary | ICD-10-CM | POA: Diagnosis not present

## 2020-05-08 DIAGNOSIS — G8929 Other chronic pain: Secondary | ICD-10-CM | POA: Diagnosis not present

## 2020-05-08 NOTE — Patient Instructions (Addendum)

## 2020-05-08 NOTE — Therapy (Signed)
[facility] [address]  [place], [place], [address] Phone: [phone]   Fax:  [phone]  Physical Therapy Treatment  Patient Details  Name: Kristina Weber MRN: [id number] Date of Birth: 01/28/1946 Referring Provider (PT): Raelyn Number, [place]   Encounter Date: 05/08/2020  PT End of Session - 05/08/20 0941    Visit Number  6    Number of Visits  7    Date for PT Re-Evaluation  05/15/20    Authorization Type  [employer]na Medicare    PT Start Time  0932    PT Stop Time  1015    PT Time Calculation (min)  43 min    Activity Tolerance  Patient tolerated treatment well    Behavior During Therapy  [facility] for tasks assessed/performed       No past medical history on file.  No past surgical history on file.  There were no vitals filed for this visit.  Subjective Assessment - 05/08/20 0936    Subjective  Pt. doing well.    Pertinent History  hypothyroidism, psoriasis, CKD III, PVD, HTN, R TKA    Diagnostic tests  lumbar xray 03/01/20: narrowing of disc spaces of L3-4 and L5-S1 and mild spondylosis    Patient Stated Goals  "be able to do more walking"    Currently in Pain?  No/denies    Pain Score  0-No pain    Pain Location  Back    Pain Orientation  Right;Left         [facility] PT Assessment - 05/08/20 0001      AROM   AROM Assessment Site  Lumbar    Lumbar Flexion  ankle    Lumbar Extension  mildly limited    Lumbar - Right Side Bend  distal thigh    Lumbar - Left Side Bend  distal thigh    Lumbar - Right Rotation  WFL    Lumbar - Left Rotation  [facility]                    [facility] Adult PT Treatment/Exercise - 05/08/20 0001      Self-Care   Self-Care  Other Self-Care Comments    Other Self-Care Comments   discussion of proper posture and body mechanics with daily activities for reduce lumbar strain; discussion of current POC and pt. current progress; discussion of appropriate LE machine strengthening  for performance at gym as pt. with plans to return next week.        Lumbar Exercises: Stretches   Passive Hamstring Stretch  Right;Left;1 rep;30 seconds    Passive Hamstring Stretch Limitations  supine + strap       Lumbar Exercises: Aerobic   Recumbent Bike  L1 x 7 min      Lumbar Exercises: Machines for Strengthening   Cybex Knee Flexion  B LEs: 20# x 10 rpes    cues for slow movement/control    Leg Press  B LEs: 25# x 15 reps    cues required for slow movements    Other Lumbar Machine Exercise  B Low row 10# x 15 rpes    cues for neutral trunk      Lumbar Exercises: Supine   Bridge  5 seconds   x 12 reps; + isometric hip abd/ER into red TB at knees    Bridge Limitations  red TB at knees              PT  Education - 05/08/20 1028    Education Details  Posture and body mechanics handout;  machine gym handout for HS curl machine, row machine, leg press machine    Person(s) Educated  Patient    Methods  Explanation;Demonstration;Verbal cues;Handout    Comprehension  Verbalized understanding;Returned demonstration;Verbal cues required       PT Short Term Goals - 04/10/20 0937      PT SHORT TERM GOAL #1   Title  Patient to be independent with initial HEP.    Time  3    Period  Weeks    Status  Achieved    Target Date  04/24/20        PT Long Term Goals - 05/08/20 1035      PT LONG TERM GOAL #1   Title  Patient to be independent with advanced HEP.    Time  6    Period  Weeks    Status  Partially Met      PT LONG TERM GOAL #2   Title  Patient to demonstrate lumbar AROM WFL and without pain limiting.    Time  6    Period  Weeks    Status  Partially Met   05/08/20; able to demo all lumbar AROM without pain limiting; B rotation, side bending WFL, slightly limited in lumbar flexion, ext     PT LONG TERM GOAL #3   Title  Patient to return to walking/gym regimen with modifications as needed.    Time  6    Period  Weeks    Status  On-going      PT LONG TERM  GOAL #4   Title  Patient to demonstrate safe lifting mechanics when lifitng 20lb box from floor.    Time  6    Period  Weeks    Status  Partially Met   04/24/20:  good hip/knee flexion, limited awareness of neutral spine     PT LONG TERM GOAL #5   Title  Patient to report tolerance of walking 1.5 miles without pain limiting.    Time  6    Period  Weeks    Status  On-going            Plan - 05/08/20 5638    Clinical Impression Statement  Pt. noting 75% improvement in pain since starting physical therapy.  Able to demo progress toward LTG #2 partially meeting with B lumbar AROM rotation, side bending pain free and WFL.  Still with mild limitation into AROM lumbar extension, flexion however pain free.  Pt. reporting she has plans to return to the gym for LE/core strengthening activities next week thus addressed proper technique with machine strengthening and proper pacing as pt. with tendency to perform machine strengthening with excessively quick pacing requiring heavy cueing for slow/controlled motion for improved benefit.  Did briefly cover posture and body mechanics handout with pt. as pt. 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  Pt. verbalizing, she may feel ready for a 30-day hold from therapy given current progress and pending discussion with supervising PT at next visit    Comorbidities  hypothyroidism, psoriasis, CKD III, PVD, HTN, R TKA    Rehab Potential  Good    PT Frequency  1x / week    PT Treatment/Interventions  ADLs/Self Care Home Management;Electrical Stimulation;Cryotherapy;Moist Heat;Traction;Therapeutic exercise;Therapeutic activities;Functional mobility training;Stair training;Gait training;Ultrasound;Neuromuscular re-education;Patient/family education;Manual techniques;Taping;Energy conservation;Dry needling;Passive range of motion  PT Next Visit Plan  Lumbopelvic ROM and hip  strengthening    Consulted and Agree with Plan of Care  Patient       Patient will benefit from skilled therapeutic intervention in order to improve the following deficits and impairments:  Decreased activity tolerance, Decreased strength, Pain, Difficulty walking, Increased muscle spasms, Improper body mechanics, Decreased range of motion, Postural dysfunction, Impaired flexibility  Visit Diagnosis: Chronic midline low back pain with right-sided sciatica  Difficulty in walking, not elsewhere classified  Other symptoms and signs involving the musculoskeletal system     Problem List There are no problems to display for this patient.   Bess Harvest, PTA 05/08/20 10:41 AM   [facility] [address]  [place], [place], [address] Phone: [phone]   Fax:  [phone]  Name: Kristina Weber MRN: [id number] Date of Birth: 1946/03/01

## 2020-05-15 ENCOUNTER — Encounter: Payer: Self-pay | Admitting: Physical Therapy

## 2020-05-15 ENCOUNTER — Other Ambulatory Visit: Payer: Self-pay

## 2020-05-15 ENCOUNTER — Ambulatory Visit: Payer: Medicare HMO | Admitting: Physical Therapy

## 2020-05-15 DIAGNOSIS — G8929 Other chronic pain: Secondary | ICD-10-CM

## 2020-05-15 DIAGNOSIS — R29898 Other symptoms and signs involving the musculoskeletal system: Secondary | ICD-10-CM | POA: Diagnosis not present

## 2020-05-15 DIAGNOSIS — M5441 Lumbago with sciatica, right side: Secondary | ICD-10-CM

## 2020-05-15 DIAGNOSIS — R262 Difficulty in walking, not elsewhere classified: Secondary | ICD-10-CM

## 2020-05-15 NOTE — Therapy (Signed)
[facility] [address]  [place], [place], [address] Phone: [phone]   Fax:  [phone]  Physical Therapy Treatment  Patient Details  Name: Kristina Weber MRN: [id number] Date of Birth: February 11, 1946 Referring Provider (PT): Raelyn Number, [place]   Progress Note Reporting Period 04/03/20 to 05/15/20  See note below for Objective Data and Assessment of Progress/Goals.     Encounter Date: 05/15/2020  PT End of Session - 05/15/20 1008    Visit Number  7    Number of Visits  7    Date for PT Re-Evaluation  05/15/20    Authorization Type  [employer]    PT Start Time  [phone]    PT Stop Time  1006    PT Time Calculation (min)  35 min    Activity Tolerance  Patient tolerated treatment well    Behavior During Therapy  [facility] for tasks assessed/performed       History reviewed. No pertinent past medical history.  History reviewed. No pertinent surgical history.  There were no vitals filed for this visit.  Subjective Assessment - 05/15/20 0932    Subjective  DOing just fine. Has been working on [employer] and has started going back to the gym. Reports 98% improvement since initial eval.    Pertinent History  hypothyroidism, psoriasis, CKD III, PVD, HTN, R TKA    Diagnostic tests  lumbar xray 03/01/20: narrowing of disc spaces of L3-4 and L5-S1 and mild spondylosis    Patient Stated Goals  "be able to do more walking"    Currently in Pain?  No/denies         [facility] PT Assessment - 05/15/20 0001      Assessment   Medical Diagnosis  Lumbar DDD    Referring Provider (PT)  Raelyn Number, PA    Onset Date/Surgical Date  01/04/20      Observation/Other Assessments   Focus on Therapeutic Outcomes (FOTO)   Lumbar: 76 (24% limited, 36% predicted)      AROM   AROM Assessment Site  Lumbar    Lumbar Flexion  ankle    Lumbar Extension  mildly limited    Lumbar - Right Side Bend  proximal shin   slight flexion  compensation   Lumbar - Left Side Bend  proximal shin   slight flexion compensation   Lumbar - Right Rotation  WFL    Lumbar - Left Rotation  WFL                    [facility] Adult PT Treatment/Exercise - 05/15/20 0001      Therapeutic Activites    Therapeutic Activities  Lifting    Lifting  edu, demo, and practice lifting 10# and 20# box from floor and simulating placing it into a grocery cart   cues to maintain neutral spine, keep close to object     Lumbar Exercises: Aerobic   UBE (Upper Arm Bike)  L4x6 min UEs/LEs      Lumbar Exercises: Supine   Bridge with March  10 reps    Bridge with Diona Foley Squeeze Limitations  limited ROM and hip instability      Knee/Hip Exercises: Stretches   Piriformis Stretch  Right;Left;2 reps;30 seconds    Piriformis Stretch Limitations  R/L fig 4 stretch in supine and sitting             PT Education - 05/15/20 1007    Education Details  update  to HEP; discussion on objecitve progress and remaining impairments; discussion on appropriate gym equipment    Person(s) Educated  Patient    Methods  Explanation;Demonstration;Tactile cues;Verbal cues;Handout    Comprehension  Verbalized understanding;Returned demonstration       PT Short Term Goals - 05/15/20 0935      PT SHORT TERM GOAL #1   Title  Patient to be independent with initial HEP.    Time  3    Period  Weeks    Status  Achieved    Target Date  04/24/20        PT Long Term Goals - 05/15/20 0935      PT LONG TERM GOAL #1   Title  Patient to be independent with advanced HEP.    Time  6    Period  Weeks    Status  Achieved      PT LONG TERM GOAL #2   Title  Patient to demonstrate lumbar AROM WFL and without pain limiting.    Time  6    Period  Weeks    Status  Partially Met   mildly limited in extension     PT LONG TERM GOAL #3   Title  Patient to return to walking/gym regimen with modifications as needed.    Time  6    Period  Weeks    Status  Achieved       PT LONG TERM GOAL #4   Title  Patient to demonstrate safe lifting mechanics when lifitng 20lb box from floor.    Time  6    Period  Weeks    Status  Partially Met   cueing required to maintain neutral spine with inconsistent performance; no pain     PT LONG TERM GOAL #5   Title  Patient to report tolerance of walking 1.5 miles without pain limiting.    Time  6    Period  Weeks    Status  Partially Met   reports 30 min on treadmill, unsure how far           Plan - 05/15/20 1009    Clinical Impression Statement  Patient reporting 98% improvement since initial eval. Notes compliance with HEP and has recently started going back to the gym. Patient has met or partially met all goals at this time. Able to tolerate 30 minutes on the treadmill without pain. Notes improvement in ability to lift cases of water from the floor. Assessed lifting mechanics today- patient requiring cueing to maintain neutral spine with inconsistent performance. However, patient able to complete this without pain. Lumbar AROM has improved in flexion and B side bending as well as pain levels. Reviewed HEP and worked on more challenging strengthening and stretching activities in order to progress HEP. Patient tolerated these exercises well, thus HEP was updated. All questions encouraged and answered today. Patient now ready to transition to home program on 30 day hold.    Comorbidities  hypothyroidism, psoriasis, CKD III, PVD, HTN, R TKA    Rehab Potential  Good    PT Frequency  1x / week    PT Treatment/Interventions  ADLs/Self Care Home Management;Electrical Stimulation;Cryotherapy;Moist Heat;Traction;Therapeutic exercise;Therapeutic activities;Functional mobility training;Stair training;Gait training;Ultrasound;Neuromuscular re-education;Patient/family education;Manual techniques;Taping;Energy conservation;Dry needling;Passive range of motion    PT Next Visit Plan  30 day hold at this time    Consulted and Agree with  Plan of Care  Patient       Patient will benefit from skilled therapeutic intervention  in order to improve the following deficits and impairments:  Decreased activity tolerance, Decreased strength, Pain, Difficulty walking, Increased muscle spasms, Improper body mechanics, Decreased range of motion, Postural dysfunction, Impaired flexibility  Visit Diagnosis: Chronic midline low back pain with right-sided sciatica  Difficulty in walking, not elsewhere classified  Other symptoms and signs involving the musculoskeletal system     Problem List There are no problems to display for this patient.    Janene Harvey, PT, DPT 05/15/20 10:18 AM   [facility] [address]  [place], [place], [address] Phone: [phone]   Fax:  [phone]  Name: Kristina Weber MRN: [id number] Date of Birth: 1946-07-24

## 2020-07-03 DIAGNOSIS — E782 Mixed hyperlipidemia: Secondary | ICD-10-CM | POA: Diagnosis not present

## 2020-07-03 DIAGNOSIS — M479 Spondylosis, unspecified: Secondary | ICD-10-CM | POA: Diagnosis not present

## 2020-07-03 DIAGNOSIS — N032 Chronic nephritic syndrome with diffuse membranous glomerulonephritis: Secondary | ICD-10-CM | POA: Diagnosis not present

## 2020-07-03 DIAGNOSIS — N1832 Chronic kidney disease, stage 3b: Secondary | ICD-10-CM | POA: Diagnosis not present

## 2020-07-03 DIAGNOSIS — I10 Essential (primary) hypertension: Secondary | ICD-10-CM | POA: Diagnosis not present

## 2020-07-03 DIAGNOSIS — M792 Neuralgia and neuritis, unspecified: Secondary | ICD-10-CM | POA: Diagnosis not present

## 2020-07-03 DIAGNOSIS — L409 Psoriasis, unspecified: Secondary | ICD-10-CM | POA: Diagnosis not present

## 2020-07-03 DIAGNOSIS — E039 Hypothyroidism, unspecified: Secondary | ICD-10-CM | POA: Diagnosis not present

## 2020-07-07 DIAGNOSIS — L400 Psoriasis vulgaris: Secondary | ICD-10-CM | POA: Diagnosis not present

## 2020-07-27 DIAGNOSIS — E538 Deficiency of other specified B group vitamins: Secondary | ICD-10-CM | POA: Diagnosis not present

## 2020-07-27 DIAGNOSIS — M1712 Unilateral primary osteoarthritis, left knee: Secondary | ICD-10-CM | POA: Diagnosis not present

## 2020-07-27 DIAGNOSIS — I10 Essential (primary) hypertension: Secondary | ICD-10-CM | POA: Diagnosis not present

## 2020-07-27 DIAGNOSIS — E039 Hypothyroidism, unspecified: Secondary | ICD-10-CM | POA: Diagnosis not present

## 2020-07-27 DIAGNOSIS — M1990 Unspecified osteoarthritis, unspecified site: Secondary | ICD-10-CM | POA: Diagnosis not present

## 2020-07-27 DIAGNOSIS — E782 Mixed hyperlipidemia: Secondary | ICD-10-CM | POA: Diagnosis not present

## 2020-07-27 DIAGNOSIS — K219 Gastro-esophageal reflux disease without esophagitis: Secondary | ICD-10-CM | POA: Diagnosis not present

## 2020-08-07 DIAGNOSIS — L400 Psoriasis vulgaris: Secondary | ICD-10-CM | POA: Diagnosis not present

## 2020-08-08 DIAGNOSIS — N289 Disorder of kidney and ureter, unspecified: Secondary | ICD-10-CM | POA: Diagnosis not present

## 2020-08-08 DIAGNOSIS — D649 Anemia, unspecified: Secondary | ICD-10-CM | POA: Diagnosis not present

## 2020-09-04 DIAGNOSIS — D631 Anemia in chronic kidney disease: Secondary | ICD-10-CM | POA: Diagnosis not present

## 2020-09-04 DIAGNOSIS — D649 Anemia, unspecified: Secondary | ICD-10-CM | POA: Diagnosis not present

## 2020-09-04 DIAGNOSIS — I10 Essential (primary) hypertension: Secondary | ICD-10-CM | POA: Diagnosis not present

## 2020-09-04 DIAGNOSIS — D518 Other vitamin B12 deficiency anemias: Secondary | ICD-10-CM | POA: Diagnosis not present

## 2020-09-04 DIAGNOSIS — N183 Chronic kidney disease, stage 3 unspecified: Secondary | ICD-10-CM | POA: Diagnosis not present

## 2020-09-04 DIAGNOSIS — N390 Urinary tract infection, site not specified: Secondary | ICD-10-CM | POA: Diagnosis not present

## 2020-09-12 DIAGNOSIS — N200 Calculus of kidney: Secondary | ICD-10-CM | POA: Diagnosis not present

## 2020-09-20 DIAGNOSIS — L400 Psoriasis vulgaris: Secondary | ICD-10-CM | POA: Diagnosis not present

## 2020-09-25 DIAGNOSIS — N1831 Chronic kidney disease, stage 3a: Secondary | ICD-10-CM | POA: Diagnosis not present

## 2020-09-25 DIAGNOSIS — K8020 Calculus of gallbladder without cholecystitis without obstruction: Secondary | ICD-10-CM | POA: Diagnosis not present

## 2020-09-25 DIAGNOSIS — K219 Gastro-esophageal reflux disease without esophagitis: Secondary | ICD-10-CM | POA: Diagnosis not present

## 2020-09-25 DIAGNOSIS — K8689 Other specified diseases of pancreas: Secondary | ICD-10-CM | POA: Diagnosis not present

## 2020-09-25 DIAGNOSIS — I10 Essential (primary) hypertension: Secondary | ICD-10-CM | POA: Diagnosis not present

## 2020-10-10 DIAGNOSIS — Z1231 Encounter for screening mammogram for malignant neoplasm of breast: Secondary | ICD-10-CM | POA: Diagnosis not present

## 2020-10-23 DIAGNOSIS — E039 Hypothyroidism, unspecified: Secondary | ICD-10-CM | POA: Diagnosis not present

## 2020-10-23 DIAGNOSIS — M1712 Unilateral primary osteoarthritis, left knee: Secondary | ICD-10-CM | POA: Diagnosis not present

## 2020-10-23 DIAGNOSIS — M545 Low back pain, unspecified: Secondary | ICD-10-CM | POA: Diagnosis not present

## 2020-10-23 DIAGNOSIS — R252 Cramp and spasm: Secondary | ICD-10-CM | POA: Diagnosis not present

## 2020-10-23 DIAGNOSIS — N1831 Chronic kidney disease, stage 3a: Secondary | ICD-10-CM | POA: Diagnosis not present

## 2020-10-23 DIAGNOSIS — Z23 Encounter for immunization: Secondary | ICD-10-CM | POA: Diagnosis not present

## 2020-10-23 DIAGNOSIS — G4733 Obstructive sleep apnea (adult) (pediatric): Secondary | ICD-10-CM | POA: Diagnosis not present

## 2020-10-23 DIAGNOSIS — K219 Gastro-esophageal reflux disease without esophagitis: Secondary | ICD-10-CM | POA: Diagnosis not present

## 2020-10-23 DIAGNOSIS — M25561 Pain in right knee: Secondary | ICD-10-CM | POA: Diagnosis not present

## 2020-10-23 DIAGNOSIS — I129 Hypertensive chronic kidney disease with stage 1 through stage 4 chronic kidney disease, or unspecified chronic kidney disease: Secondary | ICD-10-CM | POA: Diagnosis not present

## 2020-10-27 DIAGNOSIS — Z96651 Presence of right artificial knee joint: Secondary | ICD-10-CM | POA: Diagnosis not present

## 2020-10-27 DIAGNOSIS — M25561 Pain in right knee: Secondary | ICD-10-CM | POA: Diagnosis not present

## 2020-11-22 DIAGNOSIS — M25561 Pain in right knee: Secondary | ICD-10-CM | POA: Diagnosis not present

## 2020-11-22 DIAGNOSIS — Z96651 Presence of right artificial knee joint: Secondary | ICD-10-CM | POA: Diagnosis not present

## 2020-11-29 DIAGNOSIS — M545 Low back pain, unspecified: Secondary | ICD-10-CM | POA: Diagnosis not present

## 2020-12-07 DIAGNOSIS — L400 Psoriasis vulgaris: Secondary | ICD-10-CM | POA: Diagnosis not present

## 2020-12-08 DIAGNOSIS — R69 Illness, unspecified: Secondary | ICD-10-CM | POA: Diagnosis not present

## 2020-12-13 ENCOUNTER — Other Ambulatory Visit: Payer: Self-pay

## 2020-12-13 ENCOUNTER — Emergency Department (HOSPITAL_BASED_OUTPATIENT_CLINIC_OR_DEPARTMENT_OTHER): Payer: Medicare HMO

## 2020-12-13 ENCOUNTER — Emergency Department (HOSPITAL_BASED_OUTPATIENT_CLINIC_OR_DEPARTMENT_OTHER)
Admission: EM | Admit: 2020-12-13 | Discharge: 2020-12-13 | Disposition: A | Discharge status: Home / Self Care | Payer: Medicare HMO | Attending: Emergency Medicine | Admitting: Emergency Medicine

## 2020-12-13 ENCOUNTER — Encounter (HOSPITAL_BASED_OUTPATIENT_CLINIC_OR_DEPARTMENT_OTHER): Payer: Self-pay | Admitting: *Deleted

## 2020-12-13 DIAGNOSIS — Z23 Encounter for immunization: Secondary | ICD-10-CM | POA: Insufficient documentation

## 2020-12-13 DIAGNOSIS — S61313A Laceration without foreign body of left middle finger with damage to nail, initial encounter: Secondary | ICD-10-CM | POA: Insufficient documentation

## 2020-12-13 DIAGNOSIS — I10 Essential (primary) hypertension: Secondary | ICD-10-CM | POA: Insufficient documentation

## 2020-12-13 DIAGNOSIS — S62639B Displaced fracture of distal phalanx of unspecified finger, initial encounter for open fracture: Secondary | ICD-10-CM

## 2020-12-13 DIAGNOSIS — Z7982 Long term (current) use of aspirin: Secondary | ICD-10-CM | POA: Diagnosis not present

## 2020-12-13 DIAGNOSIS — W231XXA Caught, crushed, jammed, or pinched between stationary objects, initial encounter: Secondary | ICD-10-CM | POA: Insufficient documentation

## 2020-12-13 DIAGNOSIS — S62633B Displaced fracture of distal phalanx of left middle finger, initial encounter for open fracture: Secondary | ICD-10-CM | POA: Insufficient documentation

## 2020-12-13 DIAGNOSIS — Z79899 Other long term (current) drug therapy: Secondary | ICD-10-CM | POA: Diagnosis not present

## 2020-12-13 DIAGNOSIS — S61213A Laceration without foreign body of left middle finger without damage to nail, initial encounter: Secondary | ICD-10-CM | POA: Diagnosis not present

## 2020-12-13 DIAGNOSIS — S60943A Unspecified superficial injury of left middle finger, initial encounter: Secondary | ICD-10-CM | POA: Diagnosis present

## 2020-12-13 DIAGNOSIS — S62633A Displaced fracture of distal phalanx of left middle finger, initial encounter for closed fracture: Secondary | ICD-10-CM | POA: Diagnosis not present

## 2020-12-13 HISTORY — DX: Hyperlipidemia, unspecified: E78.5

## 2020-12-13 HISTORY — DX: Essential (primary) hypertension: I10

## 2020-12-13 MED ORDER — CEPHALEXIN 250 MG PO CAPS
500.0000 mg | ORAL_CAPSULE | Freq: Once | ORAL | Status: AC
  Administered 2020-12-13: 500 mg via ORAL
  Filled 2020-12-13: qty 2

## 2020-12-13 MED ORDER — OXYCODONE-ACETAMINOPHEN 5-325 MG PO TABS
1.0000 | ORAL_TABLET | Freq: Once | ORAL | Status: AC
  Administered 2020-12-13: 1 via ORAL
  Filled 2020-12-13: qty 1

## 2020-12-13 MED ORDER — CEPHALEXIN 500 MG PO CAPS: 500.0000 mg | ORAL_CAPSULE | Freq: Four times a day (QID) | ORAL | 0 refills | Status: AC

## 2020-12-13 MED ORDER — HYDROCODONE-ACETAMINOPHEN 5-325 MG PO TABS
1.0000 | ORAL_TABLET | Freq: Four times a day (QID) | ORAL | 0 refills | Status: AC | PRN
Start: 2020-12-13 — End: ?

## 2020-12-13 MED ORDER — LIDOCAINE HCL 2 % IJ SOLN
10.0000 mL | Freq: Once | INTRAMUSCULAR | Status: AC
  Administered 2020-12-13: 200 mg via INTRADERMAL
  Filled 2020-12-13: qty 20

## 2020-12-13 MED ORDER — TETANUS-DIPHTH-ACELL PERTUSSIS 5-2.5-18.5 LF-MCG/0.5 IM SUSY
0.5000 mL | PREFILLED_SYRINGE | Freq: Once | INTRAMUSCULAR | Status: AC
  Administered 2020-12-13: 0.5 mL via INTRAMUSCULAR
  Filled 2020-12-13: qty 0.5

## 2020-12-13 NOTE — ED Triage Notes (Addendum)
C/o left 3rd finger injury x 5 hrs ago , sent from [facility] for eval by ortho for ? Open fx

## 2020-12-13 NOTE — ED Notes (Signed)
DRESSING APPLIED TO FINGER WITH NONSTICK UNDER STATIC SPLINT

## 2020-12-13 NOTE — ED Provider Notes (Signed)
[facility] EMERGENCY DEPARTMENT Provider Note   CSN: [id number] Arrival date & time: 12/13/20  1835     History Chief Complaint  Patient presents with  . Finger Injury    Kristina Weber is a 74 y.o. female.  The history is provided by the patient. No language interpreter was used.     74 year old female presenting to the ED for evaluation of finger injury.  Patient reports 6 hours ago she was taking out her groceries from her truck and accidentally slammed the trunk door onto her left middle finger.  States her finger was stuck requiring someone else to open the truck before she can remove her finger.  She report acute onset of sharp throbbing achy 10 out of 10 pain to the tip of the finger at the site of injury.  She went to urgent care center who cleaned the wound but recommend patient to come to the ER for further care.  She is not up-to-date with tetanus.  She denies any associated numbness.  She is not allergic to any antibiotic.  No other injuries.  Past Medical History:  Diagnosis Date  . Hyperlipidemia   . Hypertension     There are no problems to display for this patient.   Past Surgical History:  Procedure Laterality Date  . REPLACEMENT TOTAL KNEE       OB History   No obstetric history on file.     No family history on file.  Social History   Tobacco Use  . Smoking status: Never Smoker  Substance Use Topics  . Alcohol use: Not Currently  . Drug use: Not Currently    Home Medications Prior to Admission medications   Medication Sig Start Date End Date Taking? Authorizing Provider  amlodipine-atorvastatin (CADUET) 10-10 MG tablet Take 1 tablet by mouth daily.    [provider]  aspirin EC 81 MG tablet Take 81 mg by mouth daily.    [provider]  augmented betamethasone dipropionate (DIPROLENE-AF) 0.05 % ointment Apply topically 2 (two) times daily.    [provider]  calcium-vitamin D (OSCAL WITH D) 500-200  MG-UNIT tablet Take 1 tablet by mouth.    [provider]  famotidine (PEPCID) 40 MG tablet Take 40 mg by mouth daily.    [provider]  gabapentin (NEURONTIN) 300 MG capsule Take 300 mg by mouth 3 (three) times daily.    [provider]  hydrochlorothiazide (MICROZIDE) 12.5 MG capsule Take 12.5 mg by mouth daily.    [provider]  labetalol (NORMODYNE) 300 MG tablet Take 300 mg by mouth 2 (two) times daily.    [provider]  levothyroxine (SYNTHROID) 25 MCG tablet Take 25 mcg by mouth daily before breakfast.    [provider]  losartan (COZAAR) 100 MG tablet Take 100 mg by mouth daily.    [provider]  meloxicam (MOBIC) 7.5 MG tablet Take 7.5 mg by mouth daily.    [provider]  rosuvastatin (CRESTOR) 40 MG tablet Take 40 mg by mouth daily.    [provider]  traZODone (DESYREL) 50 MG tablet Take 50 mg by mouth at bedtime.    [provider]  valACYclovir (VALTREX) 500 MG tablet Take 500 mg by mouth 2 (two) times daily.    [provider]    Allergies    Patient has no known allergies.  Review of Systems   Review of Systems  Constitutional: Negative for fever.  Skin: Positive  for wound.  Neurological: Negative for numbness.    Physical Exam Updated Vital Signs BP (!) 213/72   Pulse 74   Temp 98.8 F (37.1 C)   Resp 18   Ht  (1.626 m)   Wt 86.2 kg   SpO2 99%   BMI 32.61 kg/m   Physical Exam Vitals and nursing note reviewed.  Constitutional:      General: She is not in acute distress.    Appearance: She is well-developed and well-nourished.  HENT:     Head: Atraumatic.  Eyes:     Conjunctiva/sclera: Conjunctivae normal.  Musculoskeletal:        General: Signs of injury (Left middle finger: Laceration across nailbed and acrylic nail with bone exposure.  Brisk cap refill, no joint involvement, sensation intact.) present.     Cervical back: Neck supple.   Skin:    Findings: No rash.  Neurological:     Mental Status: She is alert.  Psychiatric:        Mood and Affect: Mood and affect normal.     ED Results / Procedures / Treatments   Labs (all labs ordered are listed, but only abnormal results are displayed) Labs Reviewed - No data to display  EKG None  Radiology DG Finger Middle Left  Result Date: 12/13/2020 CLINICAL DATA:  74 year old female with trauma to the left third digit EXAM: LEFT MIDDLE FINGER 2+V COMPARISON:  None. FINDINGS: There is a mildly displaced fracture of the tuft of the distal phalanx of the third digit. No other acute fracture. The bones are osteopenic. There is no dislocation. There is laceration of the skin of the distal aspect of the third digit. No radiopaque foreign object or soft tissue gas. IMPRESSION: Mildly displaced fracture of the tuft of the distal phalanx of the third digit. Electronically Signed   By: Elgie Collard M.D.   On: 12/13/2020 19:49    Procedures .Marland KitchenLaceration Repair  Date/Time: 12/13/2020 8:32 PM Performed by: Fayrene Helper, PA-C Authorized by: Fayrene Helper, PA-C   Consent:    Consent obtained:  Verbal   Consent given by:  Patient   Risks discussed:  Infection, need for additional repair, pain, poor cosmetic result and poor wound healing   Alternatives discussed:  No treatment and delayed treatment Universal protocol:    Procedure explained and questions answered to patient or proxy's satisfaction: yes     Relevant documents present and verified: yes     Test results available: yes     Imaging studies available: yes     Required blood products, implants, devices, and special equipment available: yes     Site/side marked: yes     Immediately prior to procedure, a time out was called: yes     Patient identity confirmed:  Verbally with patient Anesthesia:    Anesthesia method:  Local infiltration   Local anesthetic:  Lidocaine 1% w/o epi Laceration details:    Location:   Finger   Finger location:  L long finger   Length (cm):  2   Depth (mm):  5 Pre-procedure details:    Preparation:  Patient was prepped and draped in usual sterile fashion and imaging obtained to evaluate for foreign bodies Exploration:    Limited defect created (wound extended): no     Hemostasis achieved with:  Direct pressure   Imaging obtained: x-ray     Imaging outcome: foreign body not noted     Wound exploration: wound explored through full range of motion  and entire depth of wound visualized     Wound extent: underlying fracture     Contaminated: no   Treatment:    Area cleansed with:  Povidone-iodine   Amount of cleaning:  Standard   Irrigation solution:  Sterile saline   Irrigation method:  Pressure wash   Visualized foreign bodies/material removed: no     Debridement:  None   Undermining:  Minimal Skin repair:    Repair method:  Sutures   Suture size:  5-0   Suture material:  Prolene   Suture technique:  Simple interrupted   Number of sutures:  4 Approximation:    Approximation:  Close Repair type:    Repair type:  Intermediate Post-procedure details:    Dressing:  Bulky dressing and splint for protection   Procedure completion:  Tolerated well, no immediate complications   (including critical care time)  Medications Ordered in ED Medications  cephALEXin (KEFLEX) capsule 500 mg (has no administration in time range)  Tdap (BOOSTRIX) injection 0.5 mL (0.5 mLs Intramuscular Given 12/13/20 1932)  lidocaine (XYLOCAINE) 2 % (with pres) injection 200 mg (200 mg Intradermal Given by Other 12/13/20 1932)  oxyCODONE-acetaminophen (PERCOCET/ROXICET) 5-325 MG per tablet 1 tablet (1 tablet Oral Given 12/13/20 1952)    ED Course  I have reviewed the triage vital signs and the nursing notes.  Pertinent labs & imaging results that were available during my care of the patient were reviewed by me and considered in my medical decision making (see chart for details).    MDM  Rules/Calculators/A&P                          BP (!) 181/76 (BP Location: Right Arm)   Pulse 60   Temp 98.8 F (37.1 C)   Resp 14   Ht 5\' 4"  (1.626 m)   Wt 86.2 kg   SpO2 98%   BMI 32.61 kg/m   Final Clinical Impression(s) / ED Diagnoses Final diagnoses:  Open fracture of tuft of distal phalanx of finger    Rx / DC Orders ED Discharge Orders         Ordered    cephALEXin (KEFLEX) 500 MG capsule  4 times daily        12/13/20 2113    HYDROcodone-acetaminophen (NORCO/VICODIN) 5-325 MG tablet  Every 6 hours PRN        12/13/20 2113         7:26 PM Patient here with a open fracture of the distal tip of her left middle finger likely a tuft fracture.  Will obtain x-ray to confirm, Tdap given, will anesthetize a finger and perform LAC repair.  8:33 PM X-ray did confirm a nondisplaced tuft fracture.  Finger was anesthetized, cleaned, sutures applied and finger splint provided for stability and support.  Patient given antibiotic and pain medication.  She will go home with antibiotic, pain medication, and outpatient follow-up.  Care discussed with Dr. 2114.   Criss Alvine, PA-C 12/13/20 2115    2116, MD 12/13/20 2138

## 2020-12-13 NOTE — Discharge Instructions (Signed)
You have been diagnosed with a broken bone involving your left middle finger.  Please cleanse the wound daily with gentle soap and water, pat it dry, then apply Neosporin over the wound to decrease risk of infection.  Please have your sutures removed in 1 week.  Continue to wear finger splint to protect your wound it usually takes 4 to 6 weeks for a broken bone to heal.  You may follow-up with orthopedist for further care.  Take antibiotic as prescribed, you were given opiate pain medication but please be aware that it can cause drowsiness when taking it.

## 2020-12-14 DIAGNOSIS — M25561 Pain in right knee: Secondary | ICD-10-CM | POA: Diagnosis not present

## 2020-12-14 DIAGNOSIS — Z96651 Presence of right artificial knee joint: Secondary | ICD-10-CM | POA: Diagnosis not present

## 2020-12-14 DIAGNOSIS — M5416 Radiculopathy, lumbar region: Secondary | ICD-10-CM | POA: Diagnosis not present

## 2020-12-27 ENCOUNTER — Telehealth: Payer: Self-pay | Admitting: Family Medicine

## 2020-12-27 NOTE — Telephone Encounter (Signed)
Called pt to offer ED follow up appt --no answer, unable to leave message.  --glh

## 2021-01-01 DIAGNOSIS — M545 Low back pain, unspecified: Secondary | ICD-10-CM | POA: Diagnosis not present

## 2021-01-01 DIAGNOSIS — M47816 Spondylosis without myelopathy or radiculopathy, lumbar region: Secondary | ICD-10-CM | POA: Diagnosis not present

## 2021-01-01 DIAGNOSIS — M48061 Spinal stenosis, lumbar region without neurogenic claudication: Secondary | ICD-10-CM | POA: Diagnosis not present

## 2021-01-01 DIAGNOSIS — M5127 Other intervertebral disc displacement, lumbosacral region: Secondary | ICD-10-CM | POA: Diagnosis not present

## 2021-01-01 DIAGNOSIS — M4807 Spinal stenosis, lumbosacral region: Secondary | ICD-10-CM | POA: Diagnosis not present

## 2021-01-30 DIAGNOSIS — M48061 Spinal stenosis, lumbar region without neurogenic claudication: Secondary | ICD-10-CM | POA: Diagnosis not present

## 2021-02-09 DIAGNOSIS — M48062 Spinal stenosis, lumbar region with neurogenic claudication: Secondary | ICD-10-CM | POA: Diagnosis not present

## 2021-02-09 DIAGNOSIS — M5416 Radiculopathy, lumbar region: Secondary | ICD-10-CM | POA: Diagnosis not present

## 2021-02-13 DIAGNOSIS — M5136 Other intervertebral disc degeneration, lumbar region: Secondary | ICD-10-CM | POA: Diagnosis not present

## 2021-02-13 DIAGNOSIS — M7918 Myalgia, other site: Secondary | ICD-10-CM | POA: Diagnosis not present

## 2021-02-13 DIAGNOSIS — M5416 Radiculopathy, lumbar region: Secondary | ICD-10-CM | POA: Diagnosis not present

## 2021-02-13 DIAGNOSIS — G8929 Other chronic pain: Secondary | ICD-10-CM | POA: Diagnosis not present

## 2021-03-02 DIAGNOSIS — I10 Essential (primary) hypertension: Secondary | ICD-10-CM | POA: Diagnosis not present

## 2021-03-02 DIAGNOSIS — D631 Anemia in chronic kidney disease: Secondary | ICD-10-CM | POA: Diagnosis not present

## 2021-03-02 DIAGNOSIS — D649 Anemia, unspecified: Secondary | ICD-10-CM | POA: Diagnosis not present

## 2021-03-02 DIAGNOSIS — N183 Chronic kidney disease, stage 3 unspecified: Secondary | ICD-10-CM | POA: Diagnosis not present

## 2021-03-02 DIAGNOSIS — D518 Other vitamin B12 deficiency anemias: Secondary | ICD-10-CM | POA: Diagnosis not present

## 2021-03-05 DIAGNOSIS — N183 Chronic kidney disease, stage 3 unspecified: Secondary | ICD-10-CM | POA: Diagnosis not present

## 2021-03-05 DIAGNOSIS — I10 Essential (primary) hypertension: Secondary | ICD-10-CM | POA: Diagnosis not present

## 2021-03-05 DIAGNOSIS — N390 Urinary tract infection, site not specified: Secondary | ICD-10-CM | POA: Diagnosis not present

## 2021-03-06 DIAGNOSIS — G8929 Other chronic pain: Secondary | ICD-10-CM | POA: Diagnosis not present

## 2021-03-06 DIAGNOSIS — M7918 Myalgia, other site: Secondary | ICD-10-CM | POA: Diagnosis not present

## 2021-03-08 DIAGNOSIS — L400 Psoriasis vulgaris: Secondary | ICD-10-CM | POA: Diagnosis not present

## 2021-03-13 DIAGNOSIS — N289 Disorder of kidney and ureter, unspecified: Secondary | ICD-10-CM | POA: Diagnosis not present

## 2021-03-21 DIAGNOSIS — Z23 Encounter for immunization: Secondary | ICD-10-CM | POA: Diagnosis not present

## 2021-03-21 DIAGNOSIS — I129 Hypertensive chronic kidney disease with stage 1 through stage 4 chronic kidney disease, or unspecified chronic kidney disease: Secondary | ICD-10-CM | POA: Diagnosis not present

## 2021-03-21 DIAGNOSIS — E039 Hypothyroidism, unspecified: Secondary | ICD-10-CM | POA: Diagnosis not present

## 2021-03-21 DIAGNOSIS — K219 Gastro-esophageal reflux disease without esophagitis: Secondary | ICD-10-CM | POA: Diagnosis not present

## 2021-03-21 DIAGNOSIS — R69 Illness, unspecified: Secondary | ICD-10-CM | POA: Diagnosis not present

## 2021-03-21 DIAGNOSIS — Z79899 Other long term (current) drug therapy: Secondary | ICD-10-CM | POA: Diagnosis not present

## 2021-03-21 DIAGNOSIS — E782 Mixed hyperlipidemia: Secondary | ICD-10-CM | POA: Diagnosis not present

## 2021-03-21 DIAGNOSIS — Z8249 Family history of ischemic heart disease and other diseases of the circulatory system: Secondary | ICD-10-CM | POA: Diagnosis not present

## 2021-03-21 DIAGNOSIS — N1831 Chronic kidney disease, stage 3a: Secondary | ICD-10-CM | POA: Diagnosis not present

## 2021-03-21 DIAGNOSIS — E538 Deficiency of other specified B group vitamins: Secondary | ICD-10-CM | POA: Diagnosis not present

## 2021-03-21 DIAGNOSIS — Z0000 Encounter for general adult medical examination without abnormal findings: Secondary | ICD-10-CM | POA: Diagnosis not present

## 2021-03-22 DIAGNOSIS — K219 Gastro-esophageal reflux disease without esophagitis: Secondary | ICD-10-CM | POA: Diagnosis not present

## 2021-03-22 DIAGNOSIS — G629 Polyneuropathy, unspecified: Secondary | ICD-10-CM | POA: Diagnosis not present

## 2021-03-22 DIAGNOSIS — G8929 Other chronic pain: Secondary | ICD-10-CM | POA: Diagnosis not present

## 2021-03-22 DIAGNOSIS — I129 Hypertensive chronic kidney disease with stage 1 through stage 4 chronic kidney disease, or unspecified chronic kidney disease: Secondary | ICD-10-CM | POA: Diagnosis not present

## 2021-03-22 DIAGNOSIS — M1990 Unspecified osteoarthritis, unspecified site: Secondary | ICD-10-CM | POA: Diagnosis not present

## 2021-03-22 DIAGNOSIS — L409 Psoriasis, unspecified: Secondary | ICD-10-CM | POA: Diagnosis not present

## 2021-03-22 DIAGNOSIS — M5440 Lumbago with sciatica, unspecified side: Secondary | ICD-10-CM | POA: Diagnosis not present

## 2021-03-22 DIAGNOSIS — Z008 Encounter for other general examination: Secondary | ICD-10-CM | POA: Diagnosis not present

## 2021-03-22 DIAGNOSIS — E039 Hypothyroidism, unspecified: Secondary | ICD-10-CM | POA: Diagnosis not present

## 2021-03-22 DIAGNOSIS — E785 Hyperlipidemia, unspecified: Secondary | ICD-10-CM | POA: Diagnosis not present

## 2021-03-22 DIAGNOSIS — E669 Obesity, unspecified: Secondary | ICD-10-CM | POA: Diagnosis not present

## 2021-03-26 DIAGNOSIS — K8689 Other specified diseases of pancreas: Secondary | ICD-10-CM | POA: Diagnosis not present

## 2021-03-26 DIAGNOSIS — N281 Cyst of kidney, acquired: Secondary | ICD-10-CM | POA: Diagnosis not present

## 2021-03-26 DIAGNOSIS — K8020 Calculus of gallbladder without cholecystitis without obstruction: Secondary | ICD-10-CM | POA: Diagnosis not present

## 2021-04-04 DIAGNOSIS — M533 Sacrococcygeal disorders, not elsewhere classified: Secondary | ICD-10-CM | POA: Diagnosis not present

## 2021-04-05 DIAGNOSIS — R7301 Impaired fasting glucose: Secondary | ICD-10-CM | POA: Diagnosis not present

## 2021-04-05 DIAGNOSIS — D649 Anemia, unspecified: Secondary | ICD-10-CM | POA: Diagnosis not present

## 2021-06-04 DIAGNOSIS — L400 Psoriasis vulgaris: Secondary | ICD-10-CM | POA: Diagnosis not present

## 2021-06-18 ENCOUNTER — Other Ambulatory Visit: Payer: Self-pay | Admitting: *Deleted

## 2021-06-18 NOTE — Patient Outreach (Signed)
[facility]) Care Management  06/18/2021  Kristina Weber 1946-09-23 [id number]  Telephone outreach for [facility] referral.   Talked with [name] and introduced [facility] care management services. She states she has a nurse that comes and visits her once a year from [employer] to do an AWV. Discussed the importantce of having this exam to be proactive in her health care. She was hesitant to give permission for access to her EMR. Suggested NP to send brochure and letter and will call back in 2 weeks. She is in agreement with this arrangement.  [name]. [name], MSN, [facility] Gerontological Nurse Practitioner [facility] Care Management [phone]

## 2021-06-26 ENCOUNTER — Encounter (HOSPITAL_BASED_OUTPATIENT_CLINIC_OR_DEPARTMENT_OTHER): Payer: Self-pay

## 2021-06-26 ENCOUNTER — Emergency Department (HOSPITAL_BASED_OUTPATIENT_CLINIC_OR_DEPARTMENT_OTHER)
Admission: EM | Admit: 2021-06-26 | Discharge: 2021-06-26 | Disposition: A | Discharge status: Home / Self Care | Payer: Medicare HMO | Attending: Emergency Medicine | Admitting: Emergency Medicine

## 2021-06-26 ENCOUNTER — Other Ambulatory Visit: Payer: Self-pay

## 2021-06-26 DIAGNOSIS — Z79899 Other long term (current) drug therapy: Secondary | ICD-10-CM | POA: Diagnosis not present

## 2021-06-26 DIAGNOSIS — I160 Hypertensive urgency: Secondary | ICD-10-CM | POA: Diagnosis not present

## 2021-06-26 DIAGNOSIS — Z7982 Long term (current) use of aspirin: Secondary | ICD-10-CM | POA: Insufficient documentation

## 2021-06-26 DIAGNOSIS — M5432 Sciatica, left side: Secondary | ICD-10-CM | POA: Diagnosis not present

## 2021-06-26 DIAGNOSIS — I159 Secondary hypertension, unspecified: Secondary | ICD-10-CM | POA: Diagnosis not present

## 2021-06-26 DIAGNOSIS — N1831 Chronic kidney disease, stage 3a: Secondary | ICD-10-CM | POA: Diagnosis not present

## 2021-06-26 DIAGNOSIS — M48061 Spinal stenosis, lumbar region without neurogenic claudication: Secondary | ICD-10-CM | POA: Diagnosis not present

## 2021-06-26 DIAGNOSIS — Z96659 Presence of unspecified artificial knee joint: Secondary | ICD-10-CM | POA: Diagnosis not present

## 2021-06-26 DIAGNOSIS — I129 Hypertensive chronic kidney disease with stage 1 through stage 4 chronic kidney disease, or unspecified chronic kidney disease: Secondary | ICD-10-CM | POA: Diagnosis not present

## 2021-06-26 DIAGNOSIS — M5442 Lumbago with sciatica, left side: Secondary | ICD-10-CM | POA: Diagnosis not present

## 2021-06-26 DIAGNOSIS — I452 Bifascicular block: Secondary | ICD-10-CM | POA: Diagnosis not present

## 2021-06-26 DIAGNOSIS — R001 Bradycardia, unspecified: Secondary | ICD-10-CM | POA: Diagnosis not present

## 2021-06-26 DIAGNOSIS — I10 Essential (primary) hypertension: Secondary | ICD-10-CM | POA: Diagnosis not present

## 2021-06-26 LAB — CBC WITH DIFFERENTIAL/PLATELET
Abs Immature Granulocytes: 0.01 10*3/uL (ref 0.00–0.07)
Basophils Absolute: 0 10*3/uL (ref 0.0–0.1)
Basophils Relative: 1 %
Eosinophils Absolute: 0.1 10*3/uL (ref 0.0–0.5)
Eosinophils Relative: 2 %
HCT: 36.6 % (ref 36.0–46.0)
Hemoglobin: 12.3 g/dL (ref 12.0–15.0)
Immature Granulocytes: 0 %
Lymphocytes Relative: 34 %
Lymphs Abs: 1.5 10*3/uL (ref 0.7–4.0)
MCH: 32.5 pg (ref 26.0–34.0)
MCHC: 33.6 g/dL (ref 30.0–36.0)
MCV: 96.8 fL (ref 80.0–100.0)
Monocytes Absolute: 0.4 10*3/uL (ref 0.1–1.0)
Monocytes Relative: 8 %
Neutro Abs: 2.4 10*3/uL (ref 1.7–7.7)
Neutrophils Relative %: 55 %
Platelets: 219 10*3/uL (ref 150–400)
RBC: 3.78 MIL/uL — ABNORMAL LOW (ref 3.87–5.11)
RDW: 11.8 % (ref 11.5–15.5)
WBC: 4.5 10*3/uL (ref 4.0–10.5)
nRBC: 0 % (ref 0.0–0.2)

## 2021-06-26 LAB — BASIC METABOLIC PANEL
Anion gap: 4 — ABNORMAL LOW (ref 5–15)
BUN: 13 mg/dL (ref 8–23)
CO2: 27 mmol/L (ref 22–32)
Calcium: 9.1 mg/dL (ref 8.9–10.3)
Chloride: 108 mmol/L (ref 98–111)
Creatinine, Ser: 1.13 mg/dL — ABNORMAL HIGH (ref 0.44–1.00)
GFR, Estimated: 51 mL/min — ABNORMAL LOW (ref >60)
Glucose, Bld: 114 mg/dL — ABNORMAL HIGH (ref 70–99)
Potassium: 4.1 mmol/L (ref 3.5–5.1)
Sodium: 139 mmol/L (ref 135–145)

## 2021-06-26 MED ORDER — METHYLPREDNISOLONE 4 MG PO TBPK: ORAL_TABLET | ORAL | 0 refills | Status: AC

## 2021-06-26 NOTE — Discharge Instructions (Addendum)
Follow-up with your chronic pain doctor as discussed.  Fill one of the steroid prescriptions that you got today, which is still the one that is cheaper as they both equally effective.  Consider over-the-counter lidocaine patches.

## 2021-06-26 NOTE — ED Provider Notes (Signed)
[facility] EMERGENCY DEPARTMENT Provider Note   CSN: [id number] Arrival date & time: 06/26/21  1248     History Chief Complaint  Patient presents with   Hypertension    Kristina Weber is a 75 y.o. female.  The history is provided by the patient.  Hypertension This is a chronic problem. The problem occurs daily. The problem has not changed since onset.Pertinent negatives include no chest pain, no abdominal pain, no headaches and no shortness of breath. Nothing aggravates the symptoms. Nothing relieves the symptoms. Treatments tried: home med prior to arrival. The treatment provided moderate relief.      Past Medical History:  Diagnosis Date   Hyperlipidemia    Hypertension     There are no problems to display for this patient.   Past Surgical History:  Procedure Laterality Date   REPLACEMENT TOTAL KNEE       OB History   No obstetric history on file.     History reviewed. No pertinent family history.  Social History   Tobacco Use   Smoking status: Never  Substance Use Topics   Alcohol use: Not Currently   Drug use: Not Currently    Home Medications Prior to Admission medications   Medication Sig Start Date End Date Taking? Authorizing Provider  methylPREDNISolone (MEDROL DOSEPAK) 4 MG TBPK tablet Follow package insert 06/26/21  Yes Sameeha Rockefeller, DO  amlodipine-atorvastatin (CADUET) 10-10 MG tablet Take 1 tablet by mouth daily.    [provider]  aspirin EC 81 MG tablet Take 81 mg by mouth daily.    [provider]  augmented betamethasone dipropionate (DIPROLENE-AF) 0.05 % ointment Apply topically 2 (two) times daily.    [provider]  calcium-vitamin D (OSCAL WITH D) 500-200 MG-UNIT tablet Take 1 tablet by mouth.    [provider]  cephALEXin (KEFLEX) 500 MG capsule Take 1 capsule (500 mg total) by mouth 4 (four) times daily. 12/13/20   Fayrene Helper, PA-C  famotidine (PEPCID) 40 MG tablet Take 40 mg by  mouth daily.    [provider]  gabapentin (NEURONTIN) 300 MG capsule Take 300 mg by mouth 3 (three) times daily.    [provider]  hydrochlorothiazide (MICROZIDE) 12.5 MG capsule Take 12.5 mg by mouth daily.    [provider]  HYDROcodone-acetaminophen (NORCO/VICODIN) 5-325 MG tablet Take 1 tablet by mouth every 6 (six) hours as needed for moderate pain. 12/13/20   Fayrene Helper, PA-C  labetalol (NORMODYNE) 300 MG tablet Take 300 mg by mouth 2 (two) times daily.    [provider]  levothyroxine (SYNTHROID) 25 MCG tablet Take 25 mcg by mouth daily before breakfast.    [provider]  losartan (COZAAR) 100 MG tablet Take 100 mg by mouth daily.    [provider]  meloxicam (MOBIC) 7.5 MG tablet Take 7.5 mg by mouth daily.    [provider]  rosuvastatin (CRESTOR) 40 MG tablet Take 40 mg by mouth daily.    [provider]  traZODone (DESYREL) 50 MG tablet Take 50 mg by mouth at bedtime.    [provider]  valACYclovir (VALTREX) 500 MG tablet Take 500 mg by mouth 2 (two) times daily.    [provider]    Allergies    Patient has no known allergies.  Review of Systems   Review of Systems  Constitutional:  Negative for chills and fever.  HENT:  Negative for ear pain and sore throat.   Eyes:  Negative for pain and visual disturbance.  Respiratory:  Negative for cough and shortness of breath.   Cardiovascular:  Negative for chest pain and palpitations.  Gastrointestinal:  Negative for abdominal pain and vomiting.  Genitourinary:  Negative for dysuria and hematuria.  Musculoskeletal:  Positive for back pain and myalgias. Negative for arthralgias.  Skin:  Negative for color change and rash.  Neurological:  Negative for dizziness, tremors, seizures, syncope, facial asymmetry, speech difficulty, weakness, light-headedness, numbness and headaches.  All other systems reviewed and are negative.  Physical  Exam Updated Vital Signs BP (!) 185/87   Pulse (!) 50   Temp 98.2 F (36.8 C) (Oral)   Resp 20   Ht 5\' 4"  (1.626 m)   Wt 82.1 kg   SpO2 98%   BMI 31.07 kg/m   Physical Exam Vitals and nursing note reviewed.  Constitutional:      General: She is not in acute distress.    Appearance: She is well-developed. She is not ill-appearing.  HENT:     Head: Normocephalic and atraumatic.     Nose: Nose normal.     Mouth/Throat:     Mouth: Mucous membranes are moist.  Eyes:     Extraocular Movements: Extraocular movements intact.     Conjunctiva/sclera: Conjunctivae normal.     Pupils: Pupils are equal, round, and reactive to light.  Cardiovascular:     Rate and Rhythm: Normal rate and regular rhythm.     Pulses: Normal pulses.     Heart sounds: Normal heart sounds. No murmur heard. Pulmonary:     Effort: Pulmonary effort is normal. No respiratory distress.     Breath sounds: Normal breath sounds.  Abdominal:     General: Abdomen is flat.     Palpations: Abdomen is soft.     Tenderness: There is no abdominal tenderness.  Musculoskeletal:        General: Normal range of motion.     Cervical back: Normal range of motion and neck supple.  Skin:    General: Skin is warm and dry.     Capillary Refill: Capillary refill takes less than 2 seconds.  Neurological:     General: No focal deficit present.     Mental Status: She is alert and oriented to person, place, and time.     Cranial Nerves: No cranial nerve deficit.     Sensory: No sensory deficit.     Motor: No weakness.     Coordination: Coordination normal.     Comments: 5+/5 strength, normal sensation    ED Results / Procedures / Treatments   Labs (all labs ordered are listed, but only abnormal results are displayed) Labs Reviewed  CBC WITH DIFFERENTIAL/PLATELET - Abnormal; Notable for the following components:      Result Value   RBC 3.78 (*)    All other components within normal limits  BASIC METABOLIC PANEL -  Abnormal; Notable for the following components:   Glucose, Bld 114 (*)    Creatinine, Ser 1.13 (*)    GFR, Estimated 51 (*)    Anion gap 4 (*)    All other components within normal limits    EKG EKG Interpretation  Date/Time:  Tuesday June 26 2021 13:07:37 EDT Ventricular Rate:  50 PR Interval:  176 QRS Duration: 112 QT Interval:  502 QTC Calculation: 457 R Axis:   -52 Text Interpretation: Sinus bradycardia Pulmonary disease pattern Right bundle branch block Left anterior fascicular block Confirmed by Gertude Benito ([id number]) on  06/26/2021 2:57:01 PM  Radiology No results found.  Procedures Procedures   Medications Ordered in ED Medications - No data to display  ED Course  I have reviewed the triage vital signs and the nursing notes.  Pertinent labs & imaging results that were available during my care of the patient were reviewed by me and considered in my medical decision making (see chart for details).    MDM Rules/Calculators/A&P                          Kristina Weber is here for evaluation for high blood pressure.  Took blood pressure medication just before arrival.  Blood pressure 185/87.  EKG shows sinus rhythm.  No ischemic changes.  Lab work done already shows no signs of endorgan damage.  No significant anemia, electrolyte abdomen, kidney injury.  Is also having some sciatic nerve pain on the left side.  History of the same.  Has had pain injections in the low back for the same about 4 months ago with great improvement.  Recently has seemed to flared up.  She has no symptoms of cauda equina.  She has no abdominal pain.  Normal pulses.  No concern for AAA or other process.  She appears very well.  Will prescribe her Medrol Dosepak.  We will have her follow-up with her chronic pain doctor.  Discharged in good condition.  Understands return precautions.  No concern for stroke or head bleed or ACS.  Overall asymptomatic from a high blood pressure standpoint.  This chart was  dictated using voice recognition software.  Despite best efforts to proofread,  errors can occur which can change the documentation meaning.   Final Clinical Impression(s) / ED Diagnoses Final diagnoses:  Secondary hypertension  Sciatica of left side    Rx / DC Orders ED Discharge Orders          Ordered    methylPREDNISolone (MEDROL DOSEPAK) 4 MG TBPK tablet        06/26/21 1500             Virgina Norfolk, DO 06/26/21 1516

## 2021-06-26 NOTE — ED Notes (Signed)
Pt discharged to home. Discharge instructions have been discussed with patient and/or family members. Pt verbally acknowledges understanding d/c instructions, and endorses comprehension to checkout at registration before leaving.  °

## 2021-06-26 NOTE — ED Triage Notes (Signed)
Pt went to doctor today for pain in back down leg, found pt to be hypertensive in 200s systolic.  Took home antihypertensives this morning and 2 doses of clonidine 0.1mg  at doctors office without change.

## 2021-07-05 ENCOUNTER — Other Ambulatory Visit: Payer: Self-pay | Admitting: *Deleted

## 2021-07-05 NOTE — Patient Outreach (Signed)
[facility]) Care Management  07/05/2021  Kristina Weber Jun 20, 1946 [id number]  Final outreach for [employer] referral for [facility] Care Management servcies. No answer, left another message and will now close this referral as unsuccessful due to inability to contact.  [name]. [name], MSN, [facility] Gerontological Nurse Practitioner [facility] Care Management [phone]

## 2021-07-20 DIAGNOSIS — I452 Bifascicular block: Secondary | ICD-10-CM | POA: Diagnosis not present

## 2021-07-20 DIAGNOSIS — I10 Essential (primary) hypertension: Secondary | ICD-10-CM | POA: Diagnosis not present

## 2021-09-23 IMAGING — DX DG FINGER MIDDLE 2+V*L*
3 series · 3 of 3 positions shown · non-contrast
Comparison: None.

CLINICAL DATA: 74-year-old female with trauma to the left third
digit

EXAM:
LEFT MIDDLE FINGER 2+V

[finger ap]
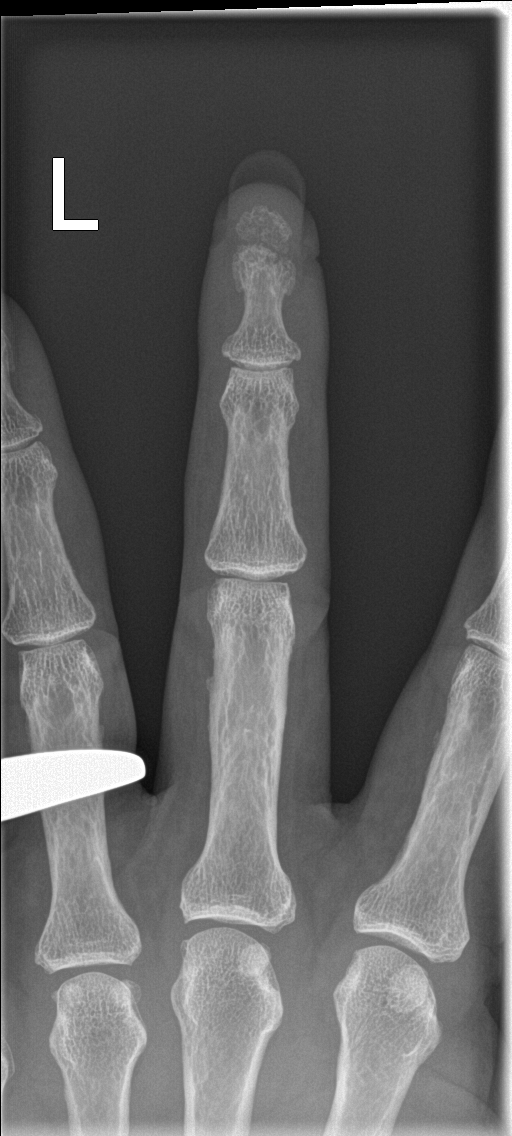

[finger obl]
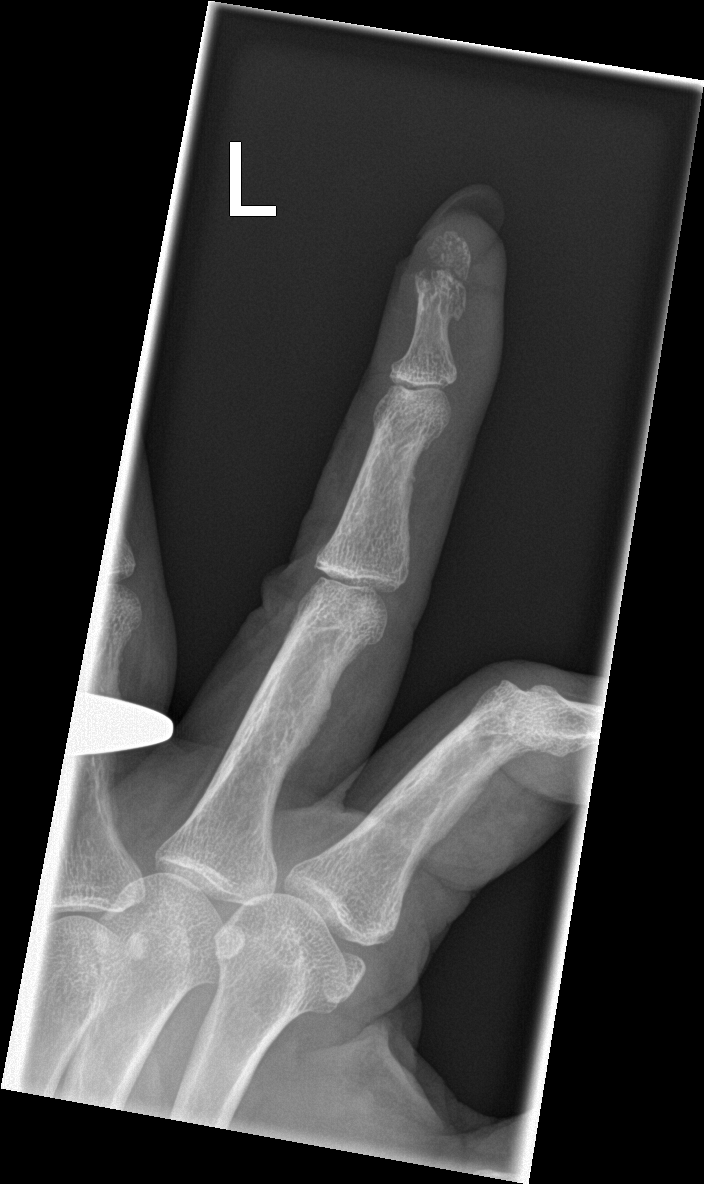

[finger lat]
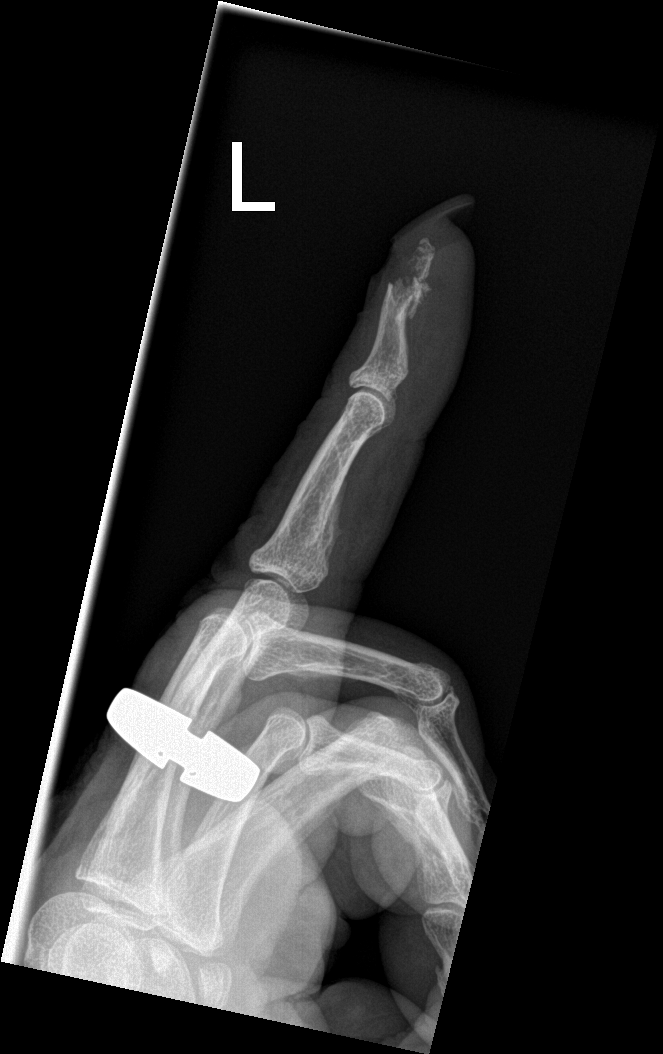

[3 of 3 positions shown; findings below may reference images not displayed]

FINDINGS: There is a mildly displaced fracture of the tuft of the distal
phalanx of the third digit. No other acute fracture. The bones are
osteopenic. There is no dislocation. There is laceration of the skin
of the distal aspect of the third digit. No radiopaque foreign
object or soft tissue gas.
IMPRESSION: Mildly displaced fracture of the tuft of the distal phalanx of the
third digit.

## 2023-04-07 ENCOUNTER — Encounter: Payer: Self-pay | Admitting: *Deleted

## 2024-04-28 ENCOUNTER — Encounter: Payer: Self-pay | Admitting: Neurology

## 2024-04-28 ENCOUNTER — Other Ambulatory Visit: Payer: Self-pay

## 2024-04-28 DIAGNOSIS — R202 Paresthesia of skin: Secondary | ICD-10-CM

## 2024-05-05 ENCOUNTER — Encounter: Payer: Self-pay | Admitting: Neurology

## 2024-06-07 ENCOUNTER — Encounter: Admitting: Neurology

## 2024-11-04 ENCOUNTER — Emergency Department (HOSPITAL_COMMUNITY)
Admission: EM | Admit: 2024-11-04 | Discharge: 2024-11-05 | Disposition: A | Discharge status: Home / Self Care | Attending: Emergency Medicine | Admitting: Emergency Medicine

## 2024-11-04 ENCOUNTER — Emergency Department (HOSPITAL_COMMUNITY)

## 2024-11-04 DIAGNOSIS — I10 Essential (primary) hypertension: Secondary | ICD-10-CM | POA: Insufficient documentation

## 2024-11-04 DIAGNOSIS — N289 Disorder of kidney and ureter, unspecified: Secondary | ICD-10-CM | POA: Diagnosis not present

## 2024-11-04 DIAGNOSIS — R638 Other symptoms and signs concerning food and fluid intake: Secondary | ICD-10-CM | POA: Diagnosis present

## 2024-11-04 LAB — CBC WITH DIFFERENTIAL/PLATELET
Abs Immature Granulocytes: 0.04 K/uL (ref 0.00–0.07)
Basophils Absolute: 0 K/uL (ref 0.0–0.1)
Basophils Relative: 1 %
Eosinophils Absolute: 0.1 K/uL (ref 0.0–0.5)
Eosinophils Relative: 2 %
HCT: 34.9 % — ABNORMAL LOW (ref 36.0–46.0)
Hemoglobin: 11.6 g/dL — ABNORMAL LOW (ref 12.0–15.0)
Immature Granulocytes: 1 %
Lymphocytes Relative: 26 %
Lymphs Abs: 1.5 K/uL (ref 0.7–4.0)
MCH: 31 pg (ref 26.0–34.0)
MCHC: 33.2 g/dL (ref 30.0–36.0)
MCV: 93.3 fL (ref 80.0–100.0)
Monocytes Absolute: 0.5 K/uL (ref 0.1–1.0)
Monocytes Relative: 10 %
Neutro Abs: 3.4 K/uL (ref 1.7–7.7)
Neutrophils Relative %: 60 %
Platelets: 301 K/uL (ref 150–400)
RBC: 3.74 MIL/uL — ABNORMAL LOW (ref 3.87–5.11)
RDW: 11.9 % (ref 11.5–15.5)
WBC: 5.5 K/uL (ref 4.0–10.5)
nRBC: 0 % (ref 0.0–0.2)

## 2024-11-04 LAB — COMPREHENSIVE METABOLIC PANEL WITH GFR
ALT: 17 U/L (ref 0–44)
AST: 29 U/L (ref 15–41)
Albumin: 3.5 g/dL (ref 3.5–5.0)
Alkaline Phosphatase: 73 U/L (ref 38–126)
Anion gap: 11 (ref 5–15)
BUN: 19 mg/dL (ref 8–23)
CO2: 24 mmol/L (ref 22–32)
Calcium: 9.2 mg/dL (ref 8.9–10.3)
Chloride: 104 mmol/L (ref 98–111)
Creatinine, Ser: 1.66 mg/dL — ABNORMAL HIGH (ref 0.44–1.00)
GFR, Estimated: 32 mL/min — ABNORMAL LOW (ref >60)
Glucose, Bld: 107 mg/dL — ABNORMAL HIGH (ref 70–99)
Potassium: 4.6 mmol/L (ref 3.5–5.1)
Sodium: 139 mmol/L (ref 135–145)
Total Bilirubin: 1.2 mg/dL (ref 0.0–1.2)
Total Protein: 6.5 g/dL (ref 6.5–8.1)

## 2024-11-04 LAB — I-STAT CHEM 8, ED
BUN: 27 mg/dL — ABNORMAL HIGH (ref 8–23)
Calcium, Ion: 1.03 mmol/L — ABNORMAL LOW (ref 1.15–1.40)
Chloride: 105 mmol/L (ref 98–111)
Creatinine, Ser: 1.8 mg/dL — ABNORMAL HIGH (ref 0.44–1.00)
Glucose, Bld: 106 mg/dL — ABNORMAL HIGH (ref 70–99)
HCT: 36 % (ref 36.0–46.0)
Hemoglobin: 12.2 g/dL (ref 12.0–15.0)
Potassium: 5.2 mmol/L — ABNORMAL HIGH (ref 3.5–5.1)
Sodium: 137 mmol/L (ref 135–145)
TCO2: 24 mmol/L (ref 22–32)

## 2024-11-04 LAB — I-STAT CG4 LACTIC ACID, ED: Lactic Acid, Venous: 1.6 mmol/L (ref 0.5–1.9)

## 2024-11-04 LAB — CBG MONITORING, ED: Glucose-Capillary: 95 mg/dL (ref 70–99)

## 2024-11-04 MED ORDER — SODIUM CHLORIDE 0.9 % IV BOLUS
1000.0000 mL | Freq: Once | INTRAVENOUS | Status: AC
  Administered 2024-11-04: 1000 mL via INTRAVENOUS

## 2024-11-04 NOTE — Discharge Instructions (Signed)
 Would encourage small amounts of fluids frequently.  Could consider Ensure or boost for calorie supplementation.  Recommend close follow-up with primary care doctor to have renal function rechecked next week.  Return for any new or worse symptoms

## 2024-11-04 NOTE — ED Provider Notes (Signed)
 [place] EMERGENCY DEPARTMENT AT [facility] Provider Note   CSN: [id number] Arrival date & time: 11/04/24  1151     Patient presents with: Altered Mental Status   Kristina Weber is a 78 y.o. female.   Patient had labs done on November 7 but did not get a report about those labs until today.  Patient was told her creatinine was 4.65 with a GFR at 9.  BUN of 41.  Patient's appetite and p.o. intake has not been great but her tongue and mouth is moist.  Family thinks there may be some developing dementia.  But no nausea vomiting or diarrhea.  Family definitely states there is some more confusion.  Patient denies any pain at this point in time.  Past medical history significant hypertension hyperlipidemia.       Prior to Admission medications   Medication Sig Start Date End Date Taking? Authorizing Provider  amlodipine-atorvastatin (CADUET) 10-10 MG tablet Take 1 tablet by mouth daily.    [provider]  aspirin EC 81 MG tablet Take 81 mg by mouth daily.    [provider]  augmented betamethasone  dipropionate (DIPROLENE -AF) 0.05 % ointment Apply topically 2 (two) times daily.    [provider]  calcium-vitamin D (OSCAL WITH D) 500-200 MG-UNIT tablet Take 1 tablet by mouth.    [provider]  cephALEXin  (KEFLEX ) 500 MG capsule Take 1 capsule (500 mg total) by mouth 4 (four) times daily. 12/13/20   Nivia Colon, PA-C  famotidine (PEPCID) 40 MG tablet Take 40 mg by mouth daily.    [provider]  gabapentin (NEURONTIN) 300 MG capsule Take 300 mg by mouth 3 (three) times daily.    [provider]  hydrochlorothiazide (MICROZIDE) 12.5 MG capsule Take 12.5 mg by mouth daily.    [provider]  HYDROcodone -acetaminophen  (NORCO/VICODIN) 5-325 MG tablet Take 1 tablet by mouth every 6 (six) hours as needed for moderate pain. 12/13/20   Nivia Colon, PA-C  labetalol (NORMODYNE) 300 MG tablet Take 300 mg by mouth 2 (two)  times daily.    [provider]  levothyroxine (SYNTHROID) 25 MCG tablet Take 25 mcg by mouth daily before breakfast.    [provider]  losartan (COZAAR) 100 MG tablet Take 100 mg by mouth daily.    [provider]  meloxicam (MOBIC) 7.5 MG tablet Take 7.5 mg by mouth daily.    [provider]  methylPREDNISolone  (MEDROL  DOSEPAK) 4 MG TBPK tablet Follow package insert 06/26/21   Curatolo, Adam, DO  rosuvastatin (CRESTOR) 40 MG tablet Take 40 mg by mouth daily.    [provider]  traZODone (DESYREL) 50 MG tablet Take 50 mg by mouth at bedtime.    [provider]  valACYclovir (VALTREX) 500 MG tablet Take 500 mg by mouth 2 (two) times daily.    [provider]    Allergies: Patient has no known allergies.    Review of Systems  Constitutional:  Positive for activity change and appetite change. Negative for chills and fever.  HENT:  Negative for ear pain and sore throat.   Eyes:  Negative for pain and visual disturbance.  Respiratory:  Negative for cough and shortness of breath.   Cardiovascular:  Negative for chest pain and palpitations.  Gastrointestinal:  Negative for abdominal pain and vomiting.  Genitourinary:  Negative for dysuria and hematuria.  Musculoskeletal:  Negative for arthralgias and back pain.  Skin:  Negative for color change and rash.  Neurological:  Negative for seizures and syncope.  All other systems reviewed and are negative.   Updated Vital Signs BP (!) 149/64 (BP Location: Right Arm)   Pulse 80   Temp 98.4 F (36.9 C) (Oral)   Resp 16   SpO2 100%   Physical Exam Vitals and nursing note reviewed.  Constitutional:      General: She is not in acute distress.    Appearance: Normal appearance. She is well-developed. She is not ill-appearing.  HENT:     Head: Normocephalic and atraumatic.     Mouth/Throat:     Mouth: Mucous membranes are moist.  Eyes:     Extraocular Movements: Extraocular  movements intact.     Conjunctiva/sclera: Conjunctivae normal.     Pupils: Pupils are equal, round, and reactive to light.  Cardiovascular:     Rate and Rhythm: Normal rate and regular rhythm.     Heart sounds: No murmur heard. Pulmonary:     Effort: Pulmonary effort is normal. No respiratory distress.     Breath sounds: Normal breath sounds.  Abdominal:     General: There is no distension.     Palpations: Abdomen is soft.     Tenderness: There is no abdominal tenderness. There is no guarding.  Musculoskeletal:        General: No swelling.     Cervical back: Neck supple.     Right lower leg: Edema present.     Left lower leg: Edema present.     Comments: Trace  Skin:    General: Skin is warm and dry.     Capillary Refill: Capillary refill takes less than 2 seconds.  Neurological:     General: No focal deficit present.     Mental Status: She is alert. Mental status is at baseline.     Cranial Nerves: No cranial nerve deficit.     Sensory: No sensory deficit.     Motor: No weakness.  Psychiatric:        Mood and Affect: Mood normal.     (all labs ordered are listed, but only abnormal results are displayed) Labs Reviewed  COMPREHENSIVE METABOLIC PANEL WITH GFR - Abnormal; Notable for the following components:      Result Value   Glucose, Bld 107 (*)    Creatinine, Ser 1.66 (*)    GFR, Estimated 32 (*)    All other components within normal limits  CBC WITH DIFFERENTIAL/PLATELET - Abnormal; Notable for the following components:   RBC 3.74 (*)    Hemoglobin 11.6 (*)    HCT 34.9 (*)    All other components within normal limits  URINALYSIS, ROUTINE W REFLEX MICROSCOPIC - Abnormal; Notable for the following components:   Protein, ur 100 (*)    Leukocytes,Ua SMALL (*)    All other components within normal limits  I-STAT CHEM 8, ED - Abnormal; Notable for the following components:   Potassium 5.2 (*)    BUN 27 (*)    Creatinine, Ser 1.80 (*)    Glucose, Bld 106 (*)     Calcium, Ion 1.03 (*)    All other components within normal limits  CULTURE, BLOOD (ROUTINE X 2)  CBG MONITORING, ED  I-STAT CG4 LACTIC ACID, ED    EKG: None  Radiology: No results found.    Procedures   Medications Ordered in the ED  sodium chloride 0.9 % bolus 1,000 mL (0 mLs Intravenous Stopped 11/05/24 0152)  Medical Decision Making  Glucose 95 complete metabolic panel creatinine 1.66 for GFR 32 glucose 107 electrolytes normal LFTs normal CBC white count 5.5 hemoglobin 11.6 platelets are 301.  Lactic acid is normal at 1.6 blood cultures x 2 pending.  CT renal stone study no acute localizing findings abdomen or pelvis there is cholelithiasis colonic diverticulosis similar small pericardial effusion.   Patient's renal function here is significantly improved compared to what they had on the seventh.  Patient's creatinine on the seventh was 4.65 today creatinine is 1.66 with a GFR of 32 pretty close to her baseline.  Will give a liter of fluid here and then have her follow-up closely with her primary care doctor.   Final diagnoses:  Renal insufficiency    ED Discharge Orders     None          Geraldene Hamilton, MD 11/07/24 1422

## 2024-11-04 NOTE — ED Notes (Signed)
 No answer for triage.

## 2024-11-04 NOTE — ED Provider Notes (Incomplete)
  EMERGENCY DEPARTMENT AT [facility] Provider Note   CSN: [id number] Arrival date & time: 11/04/24  1151     Patient presents with: Altered Mental Status   Kristina Weber is a 78 y.o. female.  {Add pertinent medical, surgical, social history, OB history to YEP:67052} Patient had labs done on November 7 but did not get a report about those labs until today.  Patient was told her creatinine was 4.65 with a GFR at 9.  BUN of 41.  Patient's appetite and p.o. intake has not been great but her tongue and mouth is moist.  Family thinks there may be some developing dementia.  But no nausea vomiting or diarrhea.  Family definitely states there is some more confusion.  Patient denies any pain at this point in time.  Past medical history significant hypertension hyperlipidemia.       Prior to Admission medications   Medication Sig Start Date End Date Taking? Authorizing Provider  amlodipine-atorvastatin (CADUET) 10-10 MG tablet Take 1 tablet by mouth daily.    [provider]  aspirin EC 81 MG tablet Take 81 mg by mouth daily.    [provider]  augmented betamethasone  dipropionate (DIPROLENE -AF) 0.05 % ointment Apply topically 2 (two) times daily.    [provider]  calcium-vitamin D (OSCAL WITH D) 500-200 MG-UNIT tablet Take 1 tablet by mouth.    [provider]  cephALEXin  (KEFLEX ) 500 MG capsule Take 1 capsule (500 mg total) by mouth 4 (four) times daily. 12/13/20   Nivia Colon, PA-C  famotidine (PEPCID) 40 MG tablet Take 40 mg by mouth daily.    [provider]  gabapentin (NEURONTIN) 300 MG capsule Take 300 mg by mouth 3 (three) times daily.    [provider]  hydrochlorothiazide (MICROZIDE) 12.5 MG capsule Take 12.5 mg by mouth daily.    [provider]  HYDROcodone -acetaminophen  (NORCO/VICODIN) 5-325 MG tablet Take 1 tablet by mouth every 6 (six) hours as needed for moderate pain. 12/13/20   Nivia Colon, PA-C  labetalol (NORMODYNE) 300 MG tablet Take 300 mg by mouth 2 (two) times daily.    [provider]  levothyroxine (SYNTHROID) 25 MCG tablet Take 25 mcg by mouth daily before breakfast.    [provider]  losartan (COZAAR) 100 MG tablet Take 100 mg by mouth daily.    [provider]  meloxicam (MOBIC) 7.5 MG tablet Take 7.5 mg by mouth daily.    [provider]  methylPREDNISolone  (MEDROL  DOSEPAK) 4 MG TBPK tablet Follow package insert 06/26/21   Curatolo, Adam, DO  rosuvastatin (CRESTOR) 40 MG tablet Take 40 mg by mouth daily.    [provider]  traZODone (DESYREL) 50 MG tablet Take 50 mg by mouth at bedtime.    [provider]  valACYclovir (VALTREX) 500 MG tablet Take 500 mg by mouth 2 (two) times daily.    [provider]    Allergies: Patient has no known allergies.    Review of Systems  Constitutional:  Positive for activity change and appetite change. Negative for chills and fever.  HENT:  Negative for ear pain and sore throat.   Eyes:  Negative for pain and visual disturbance.  Respiratory:  Negative for cough and shortness of breath.   Cardiovascular:  Negative for chest pain and palpitations.  Gastrointestinal:  Negative for abdominal pain and vomiting.  Genitourinary:  Negative for dysuria and hematuria.  Musculoskeletal:  Negative for arthralgias and back pain.  Skin:  Negative for color change and rash.  Neurological:  Negative for seizures and syncope.  All other systems reviewed and are negative.   Updated Vital Signs BP (!) 165/77 (BP Location: Left Arm)   Pulse 61   Temp 98.5 F (36.9 C) (Oral)   Resp 18   SpO2 100%   Physical Exam Vitals and nursing note reviewed.  Constitutional:      General: She is not in acute distress.    Appearance: Normal appearance. She is well-developed. She is not ill-appearing.  HENT:     Head: Normocephalic and atraumatic.     Mouth/Throat:     Mouth:  Mucous membranes are moist.  Eyes:     Extraocular Movements: Extraocular movements intact.     Conjunctiva/sclera: Conjunctivae normal.     Pupils: Pupils are equal, round, and reactive to light.  Cardiovascular:     Rate and Rhythm: Normal rate and regular rhythm.     Heart sounds: No murmur heard. Pulmonary:     Effort: Pulmonary effort is normal. No respiratory distress.     Breath sounds: Normal breath sounds.  Abdominal:     General: There is no distension.     Palpations: Abdomen is soft.     Tenderness: There is no abdominal tenderness. There is no guarding.  Musculoskeletal:        General: No swelling.     Cervical back: Neck supple.     Right lower leg: Edema present.     Left lower leg: Edema present.     Comments: Trace  Skin:    General: Skin is warm and dry.     Capillary Refill: Capillary refill takes less than 2 seconds.  Neurological:     General: No focal deficit present.     Mental Status: She is alert. Mental status is at baseline.     Cranial Nerves: No cranial nerve deficit.     Sensory: No sensory deficit.     Motor: No weakness.  Psychiatric:        Mood and Affect: Mood normal.     (all labs ordered are listed, but only abnormal results are displayed) Labs Reviewed  COMPREHENSIVE METABOLIC PANEL WITH GFR - Abnormal; Notable for the following components:      Result Value   Glucose, Bld 107 (*)    Creatinine, Ser 1.66 (*)    GFR, Estimated 32 (*)    All other components within normal limits  CBC WITH DIFFERENTIAL/PLATELET - Abnormal; Notable for the following components:   RBC 3.74 (*)    Hemoglobin 11.6 (*)    HCT 34.9 (*)    All other components within normal limits  I-STAT CHEM 8, ED - Abnormal; Notable for the following components:   Potassium 5.2 (*)    BUN 27 (*)    Creatinine, Ser 1.80 (*)    Glucose, Bld 106 (*)    Calcium, Ion 1.03 (*)    All other components within normal limits  CULTURE, BLOOD (ROUTINE X 2)  CULTURE, BLOOD  (ROUTINE X 2)  URINALYSIS, ROUTINE W REFLEX MICROSCOPIC  CBG MONITORING, ED  I-STAT CG4 LACTIC ACID, ED    EKG: None  Radiology: CT Renal Stone Study Result Date: 11/04/2024 CLINICAL DATA:  Abdominal/flank pain, stone suspected EXAM: CT ABDOMEN AND PELVIS WITHOUT CONTRAST TECHNIQUE: Multidetector CT imaging of the abdomen and pelvis was performed following the standard protocol without IV contrast. RADIATION DOSE REDUCTION: This exam was performed according to the departmental dose-optimization program  which includes automated exposure control, adjustment of the mA and/or kV according to patient size and/or use of iterative reconstruction technique. COMPARISON:  CT abdomen/pelvis 02/04/2024 and MRI abdomen dated 02/13/2024. FINDINGS: Lower chest: Similar small pericardial effusion. Subsegmental atelectasis/scarring in the lingula. Hepatobiliary: No suspicious focal hepatic lesion identified within the limits of an unenhanced exam. Numerous calcified gallstones again noted. No gallbladder wall thickening or pericholecystic fluid. No biliary dilatation. Pancreas: Redemonstrated 1.8 x 0.9 cm hypodense lesion at the junction of the pancreatic body and tail, stable in size from 12/12/2022. Additional hypodense cystic lesion arising from the dorsal pancreatic head is not well visualized on this exam and better evaluated on the prior MRI dated 02/13/2024, at which time both of the cystic lesions were favored to represent side branch IPMNs, for which no further follow-up imaging is required. No acute abnormality. Spleen: Normal in size without focal abnormality. Adrenals/Urinary Tract: Adrenal glands are unremarkable. Stable bilateral renal cysts. No urolithiasis or hydronephrosis. Bladder is unremarkable. Stomach/Bowel: Stomach is within normal limits. Appendix appears normal. Small bowel is unremarkable. No obstruction or inflammatory changes. Colonic diverticulosis without evidence of acute diverticulitis.  Vascular/Lymphatic: Abdominal aorta is normal in caliber with atherosclerotic calcification. No enlarged abdominal or pelvic lymph nodes. Reproductive: Status post hysterectomy. Unchanged 9 mm fluid density lesion along the left adnexa for which no further imaging is indicated. Other: No abdominopelvic ascites.  No intraperitoneal free air. Musculoskeletal: No acute osseous abnormality. Similar lucency along the left upper and posterior acetabulum, possibly a chronic geode. Redemonstrated degenerative disc disease of the mid to lower lumbar spine, most pronounced at L5-S1. IMPRESSION: 1. No acute localizing findings in the abdomen or pelvis. 2. Cholelithiasis. 3. Colonic diverticulosis. 4. Similar small pericardial effusion. 5. Additional unchanged chronic findings, as described above. 6.  Aortic Atherosclerosis (ICD10-I70.0). Electronically Signed   By: Harrietta Sherry M.D.   On: 11/04/2024 15:20    {Document cardiac monitor, telemetry assessment procedure when appropriate:32947} Procedures   Medications Ordered in the ED - No data to display    {Click here for ABCD2, HEART and other calculators REFRESH Note before signing:1}                              Medical Decision Making  Glucose 95 complete metabolic panel creatinine 1.66 for GFR 32 glucose 107 electrolytes normal LFTs normal CBC white count 5.5 hemoglobin 11.6 platelets are 301.  Lactic acid is normal at 1.6 blood cultures x 2 pending.  CT renal stone study no acute localizing findings abdomen or pelvis there is cholelithiasis colonic diverticulosis similar small pericardial effusion.   Patient's renal function here is significantly improved compared to what they had on the seventh.  Patient's creatinine on the seventh was 4.65 today creatinine is 1.66 with a GFR of 32 pretty close to her baseline.  Will give a liter of fluid here and then have her follow-up closely with her primary care doctor.   Final diagnoses:  None    ED  Discharge Orders     None

## 2024-11-04 NOTE — ED Triage Notes (Signed)
 Coming from home, pt was called by PCP due AKI, Pt is alert and oriented x2, disoriented to time and place, calm and cooperative.   Son bedside, report worse balance and more confusion that usual. Pain in her back and decreased urinary output.   Pt denies pain at this moment.

## 2024-11-04 NOTE — ED Triage Notes (Signed)
 Patients PCP sent patient for abnormal labs. Her granddaughter brought her in and states she thinks she is dehydrated and that patient is still making urine. Denies urinary symptoms.

## 2024-11-04 NOTE — ED Provider Triage Note (Signed)
 Emergency Medicine Provider Triage Evaluation Note  Kristina Weber , a 78 y.o. female  was evaluated in triage.  Pt complains of confusion and renal insufficiency.  She had recent lab work and had outpatient creatinine of 4.65 which is new for her.  She has had increased fatigue decreased urinary output and increased confusion per family.  Review of Systems  Positive: Confusion and fatigue Negative: Fever  Physical Exam  BP 127/64 (BP Location: Left Arm)   Pulse (!) 57   Temp 98 F (36.7 C)   Resp 18   SpO2 99%  Gen:   Awake, no distress   Resp:  Normal effort  MSK:   Moves extremities without difficulty  Other:    Medical Decision Making  Medically screening exam initiated at 1:09 PM.  Appropriate orders placed.  Kristina Weber was informed that the remainder of the evaluation will be completed by another provider, this initial triage assessment does not replace that evaluation, and the importance of remaining in the ED until their evaluation is complete.     Kristina Chroman, PA-C 11/04/24 1311

## 2024-11-05 ENCOUNTER — Other Ambulatory Visit: Payer: Self-pay

## 2024-11-05 ENCOUNTER — Encounter (HOSPITAL_COMMUNITY): Payer: Self-pay

## 2024-11-05 LAB — URINALYSIS, ROUTINE W REFLEX MICROSCOPIC
Bacteria, UA: NONE SEEN
Bilirubin Urine: NEGATIVE
Glucose, UA: NEGATIVE mg/dL
Hgb urine dipstick: NEGATIVE
Ketones, ur: NEGATIVE mg/dL
Nitrite: NEGATIVE
Protein, ur: 100 mg/dL — AB
Specific Gravity, Urine: 1.011 (ref 1.005–1.030)
pH: 5 (ref 5.0–8.0)

## 2024-11-09 LAB — CULTURE, BLOOD (ROUTINE X 2): Culture: NO GROWTH

## 2024-12-29 ENCOUNTER — Encounter (HOSPITAL_BASED_OUTPATIENT_CLINIC_OR_DEPARTMENT_OTHER): Payer: Self-pay

## 2024-12-29 ENCOUNTER — Emergency Department (HOSPITAL_BASED_OUTPATIENT_CLINIC_OR_DEPARTMENT_OTHER)
Admission: EM | Admit: 2024-12-29 | Discharge: 2024-12-29 | Disposition: A | Discharge status: Home / Self Care | Attending: Emergency Medicine | Admitting: Emergency Medicine

## 2024-12-29 ENCOUNTER — Other Ambulatory Visit: Payer: Self-pay

## 2024-12-29 ENCOUNTER — Emergency Department (HOSPITAL_BASED_OUTPATIENT_CLINIC_OR_DEPARTMENT_OTHER)

## 2024-12-29 DIAGNOSIS — Z7982 Long term (current) use of aspirin: Secondary | ICD-10-CM | POA: Insufficient documentation

## 2024-12-29 DIAGNOSIS — R748 Abnormal levels of other serum enzymes: Secondary | ICD-10-CM | POA: Diagnosis not present

## 2024-12-29 DIAGNOSIS — R600 Localized edema: Secondary | ICD-10-CM | POA: Insufficient documentation

## 2024-12-29 DIAGNOSIS — W01198A Fall on same level from slipping, tripping and stumbling with subsequent striking against other object, initial encounter: Secondary | ICD-10-CM | POA: Diagnosis not present

## 2024-12-29 DIAGNOSIS — I159 Secondary hypertension, unspecified: Secondary | ICD-10-CM | POA: Insufficient documentation

## 2024-12-29 DIAGNOSIS — W19XXXA Unspecified fall, initial encounter: Secondary | ICD-10-CM

## 2024-12-29 DIAGNOSIS — I10 Essential (primary) hypertension: Secondary | ICD-10-CM

## 2024-12-29 DIAGNOSIS — Z79899 Other long term (current) drug therapy: Secondary | ICD-10-CM | POA: Diagnosis not present

## 2024-12-29 DIAGNOSIS — S0990XA Unspecified injury of head, initial encounter: Secondary | ICD-10-CM | POA: Insufficient documentation

## 2024-12-29 LAB — CBC WITH DIFFERENTIAL/PLATELET
Abs Immature Granulocytes: 0.02 K/uL (ref 0.00–0.07)
Basophils Absolute: 0 K/uL (ref 0.0–0.1)
Basophils Relative: 1 %
Eosinophils Absolute: 0.1 K/uL (ref 0.0–0.5)
Eosinophils Relative: 2 %
HCT: 33.1 % — ABNORMAL LOW (ref 36.0–46.0)
Hemoglobin: 11 g/dL — ABNORMAL LOW (ref 12.0–15.0)
Immature Granulocytes: 0 %
Lymphocytes Relative: 42 %
Lymphs Abs: 1.9 K/uL (ref 0.7–4.0)
MCH: 32 pg (ref 26.0–34.0)
MCHC: 33.2 g/dL (ref 30.0–36.0)
MCV: 96.2 fL (ref 80.0–100.0)
Monocytes Absolute: 0.6 K/uL (ref 0.1–1.0)
Monocytes Relative: 12 %
Neutro Abs: 1.9 K/uL (ref 1.7–7.7)
Neutrophils Relative %: 43 %
Platelets: 235 K/uL (ref 150–400)
RBC: 3.44 MIL/uL — ABNORMAL LOW (ref 3.87–5.11)
RDW: 13.1 % (ref 11.5–15.5)
WBC: 4.5 K/uL (ref 4.0–10.5)
nRBC: 0 % (ref 0.0–0.2)

## 2024-12-29 LAB — TROPONIN T, HIGH SENSITIVITY
Troponin T High Sensitivity: 37 ng/L — ABNORMAL HIGH (ref 0–19)
Troponin T High Sensitivity: 33 ng/L — ABNORMAL HIGH (ref 0–19)

## 2024-12-29 LAB — PRO BRAIN NATRIURETIC PEPTIDE: Pro Brain Natriuretic Peptide: 205 pg/mL

## 2024-12-29 LAB — BASIC METABOLIC PANEL WITH GFR
Anion gap: 10 (ref 5–15)
BUN: 21 mg/dL (ref 8–23)
CO2: 25 mmol/L (ref 22–32)
Calcium: 8.9 mg/dL (ref 8.9–10.3)
Chloride: 107 mmol/L (ref 98–111)
Creatinine, Ser: 1.33 mg/dL — ABNORMAL HIGH (ref 0.44–1.00)
GFR, Estimated: 41 mL/min — ABNORMAL LOW
Glucose, Bld: 100 mg/dL — ABNORMAL HIGH (ref 70–99)
Potassium: 4 mmol/L (ref 3.5–5.1)
Sodium: 142 mmol/L (ref 135–145)

## 2024-12-29 MED ORDER — ACETAMINOPHEN 500 MG PO TABS
1000.0000 mg | ORAL_TABLET | Freq: Once | ORAL | Status: AC
  Administered 2024-12-29: 1000 mg via ORAL
  Filled 2024-12-29: qty 2

## 2024-12-29 NOTE — ED Triage Notes (Signed)
 Pt arrives with c/o HTN. Pt has hx of the same and takes BP meds and has not missed any doses. Pt reports lightheadedness, headache, BLE swelling, and cough. Pt denies CP or SOB.

## 2024-12-29 NOTE — ED Provider Notes (Signed)
 " [place] EMERGENCY DEPARTMENT AT [facility] Provider Note   CSN: [id number] Arrival date & time: 12/29/24  1631     Patient presents with: Hypertension   Kristina Weber is a 79 y.o. female.  79 year old female presents to the emergency department with complaints of hypertension, lightheadedness, headache, bilateral lower extremity edema and cough.  Patient reports hypertension was noted today she is on valsartan, labetalol, amlodipine and takes them as prescribed.  Her home health nurse reported a blood pressure 156/90 and was concerned.  Several repeat blood pressures were around 124/70.  They consulted their family medicine doctor and due to swelling, headache, lightheadedness and episode of hypertension they advised her to go to the emergency department for further evaluation.  Patient reports the headache has been present for about 2 months and has never fully gone away.  She had an episode where she fell Sunday night and hit her head on the floor.  Patient reports she remembers the fall and does not recall being on the floor until shortly after when she woke up and called for help.  Fall was unwitnessed and patient denies any bleeding or injury sustained from the injury.  She is not on blood thinners.  The lightheadedness and lower extremity edema and cough has been going on for several days.  Family reports patient has developed dementia and is slowly declining and they have to stay diligent about her eating and drinking appropriate amounts.     Prior to Admission medications  Medication Sig Start Date End Date Taking? Authorizing Provider  amlodipine-atorvastatin (CADUET) 10-10 MG tablet Take 1 tablet by mouth daily.    [provider]  aspirin EC 81 MG tablet Take 81 mg by mouth daily.    [provider]  augmented betamethasone  dipropionate (DIPROLENE -AF) 0.05 % ointment Apply topically 2 (two) times daily.    [provider]  calcium-vitamin D  (OSCAL WITH D) 500-200 MG-UNIT tablet Take 1 tablet by mouth.    [provider]  cephALEXin  (KEFLEX ) 500 MG capsule Take 1 capsule (500 mg total) by mouth 4 (four) times daily. 12/13/20   Nivia Colon, PA-C  famotidine (PEPCID) 40 MG tablet Take 40 mg by mouth daily.    [provider]  gabapentin (NEURONTIN) 300 MG capsule Take 300 mg by mouth 3 (three) times daily.    [provider]  hydrochlorothiazide (MICROZIDE) 12.5 MG capsule Take 12.5 mg by mouth daily.    [provider]  HYDROcodone -acetaminophen  (NORCO/VICODIN) 5-325 MG tablet Take 1 tablet by mouth every 6 (six) hours as needed for moderate pain. 12/13/20   Nivia Colon, PA-C  labetalol (NORMODYNE) 300 MG tablet Take 300 mg by mouth 2 (two) times daily.    [provider]  levothyroxine (SYNTHROID) 25 MCG tablet Take 25 mcg by mouth daily before breakfast.    [provider]  losartan (COZAAR) 100 MG tablet Take 100 mg by mouth daily.    [provider]  meloxicam (MOBIC) 7.5 MG tablet Take 7.5 mg by mouth daily.    [provider]  methylPREDNISolone  (MEDROL  DOSEPAK) 4 MG TBPK tablet Follow package insert 06/26/21   Curatolo, Adam, DO  rosuvastatin (CRESTOR) 40 MG tablet Take 40 mg by mouth daily.    [provider]  traZODone (DESYREL) 50 MG tablet Take 50 mg by mouth at bedtime.    [provider]  valACYclovir (VALTREX) 500 MG tablet Take 500 mg by mouth 2 (two) times daily.  [provider]    Allergies: Patient has no known allergies.    Review of Systems  Respiratory:  Positive for cough.   Cardiovascular:  Positive for leg swelling.  Neurological:  Positive for light-headedness and headaches.  All other systems reviewed and are negative.   Updated Vital Signs BP (!) 170/64   Pulse (!) 50   Temp 98.4 F (36.9 C) (Oral)   Resp 17   Wt 82.1 kg   SpO2 98%   BMI 31.07 kg/m   Physical Exam Vitals and nursing note  reviewed.  Constitutional:      General: She is not in acute distress.    Appearance: Normal appearance. She is not ill-appearing.  HENT:     Head: Normocephalic and atraumatic.     Comments: No laceration hematoma or injury noted to head.    Nose: Nose normal.  Eyes:     Extraocular Movements: Extraocular movements intact.     Conjunctiva/sclera: Conjunctivae normal.     Pupils: Pupils are equal, round, and reactive to light.     Comments: Pupils equal and reactive bilaterally.  No photophobia or pain with EOM.  Cardiovascular:     Rate and Rhythm: Normal rate.     Heart sounds: Normal heart sounds.  Pulmonary:     Effort: Pulmonary effort is normal. No respiratory distress.     Breath sounds: Normal breath sounds.     Comments: Lungs are clear to auscultation all fields Abdominal:     General: Abdomen is flat.     Tenderness: There is no abdominal tenderness. There is no guarding.  Musculoskeletal:        General: Normal range of motion.     Cervical back: Normal range of motion.     Comments: No pitting edema noted in bilateral extremities.  Skin:    General: Skin is warm.     Capillary Refill: Capillary refill takes less than 2 seconds.  Neurological:     General: No focal deficit present.     Mental Status: She is alert.     Comments: Patient has no focal deficits or focal weakness.  Psychiatric:        Mood and Affect: Mood normal.        Behavior: Behavior normal.     (all labs ordered are listed, but only abnormal results are displayed) Labs Reviewed  CBC WITH DIFFERENTIAL/PLATELET - Abnormal; Notable for the following components:      Result Value   RBC 3.44 (*)    Hemoglobin 11.0 (*)    HCT 33.1 (*)    All other components within normal limits  BASIC METABOLIC PANEL WITH GFR - Abnormal; Notable for the following components:   Glucose, Bld 100 (*)    Creatinine, Ser 1.33 (*)    GFR, Estimated 41 (*)    All other components within normal limits  TROPONIN  T, HIGH SENSITIVITY - Abnormal; Notable for the following components:   Troponin T High Sensitivity 37 (*)    All other components within normal limits  TROPONIN T, HIGH SENSITIVITY - Abnormal; Notable for the following components:   Troponin T High Sensitivity 33 (*)    All other components within normal limits  PRO BRAIN NATRIURETIC PEPTIDE    EKG: EKG Interpretation Date/Time:  Wednesday December 29 2024 18:44:56 EST Ventricular Rate:  59 PR Interval:  183 QRS Duration:  135 QT Interval:  471 QTC Calculation: 467 R Axis:   -59  Text Interpretation: Sinus  rhythm Multiple ventricular premature complexes RBBB and LAFB No significant change since last tracing Confirmed by Doretha Folks ([id number]) on 12/29/2024 8:19:20 PM  Radiology: CT Cervical Spine Wo Contrast Result Date: 12/29/2024 CLINICAL DATA:  Status post fall 1 week ago. EXAM: CT CERVICAL SPINE WITHOUT CONTRAST TECHNIQUE: Multidetector CT imaging of the cervical spine was performed without intravenous contrast. Multiplanar CT image reconstructions were also generated. RADIATION DOSE REDUCTION: This exam was performed according to the departmental dose-optimization program which includes automated exposure control, adjustment of the mA and/or kV according to patient size and/or use of iterative reconstruction technique. COMPARISON:  None Available. FINDINGS: Alignment: There is approximately 1 mm anterolisthesis of the C4 vertebral body on C5, with 1 mm to 2 mm retrolisthesis of C6 on C7. Skull base and vertebrae: No acute fracture. No primary bone lesion or focal pathologic process. Soft tissues and spinal canal: No prevertebral fluid or swelling. No visible canal hematoma. Disc levels: Marked severity endplate sclerosis, anterior osteophyte formation and posterior bony spurring are seen at the levels of C4-C5, C5-C6 and C6-C7. There is marked severity intervertebral disc space narrowing at C4-C5, C5-C6 and C6-C7. Bilateral moderate to  marked severity multilevel facet joint hypertrophy is noted. Upper chest: Negative. Other: None. IMPRESSION: 1. No acute fracture or traumatic subluxation. 2. Marked severity multilevel degenerative changes, as described above. Electronically Signed   By: Suzen Dials M.D.   On: 12/29/2024 18:29   CT Head Wo Contrast Result Date: 12/29/2024 CLINICAL DATA:  Status post fall 1 week ago. EXAM: CT HEAD WITHOUT CONTRAST TECHNIQUE: Contiguous axial images were obtained from the base of the skull through the vertex without intravenous contrast. RADIATION DOSE REDUCTION: This exam was performed according to the departmental dose-optimization program which includes automated exposure control, adjustment of the mA and/or kV according to patient size and/or use of iterative reconstruction technique. COMPARISON:  None Available. FINDINGS: Brain: There is generalized cerebral atrophy with widening of the extra-axial spaces and ventricular dilatation. There are areas of decreased attenuation within the white matter tracts of the supratentorial brain, consistent with microvascular disease changes. A small, chronic left basal ganglia lacunar infarct is noted. Vascular: No hyperdense vessel or unexpected calcification. Skull: Normal. Negative for fracture or focal lesion. Sinuses/Orbits: There is moderate severity right maxillary sinus and marked severity right ethmoid sinus mucosal thickening. Mild right-sided frontal sinus mucosal thickening is also noted. Other: None. IMPRESSION: 1. Generalized cerebral atrophy with chronic white matter small vessel ischemic changes. 2. Small, chronic left basal ganglia lacunar infarct. 3. Moderate severity right maxillary sinus and marked severity right ethmoid sinus disease. Electronically Signed   By: Suzen Dials M.D.   On: 12/29/2024 18:27   DG Chest 2 View Result Date: 12/29/2024 EXAM: 2 VIEW(S) XRAY OF THE CHEST 12/29/2024 06:04:00 PM COMPARISON: None available. CLINICAL  HISTORY: cough FINDINGS: LUNGS AND PLEURA: No focal pulmonary opacity. No pleural effusion. No pneumothorax. HEART AND MEDIASTINUM: Cardiomegaly. Calcified aorta. BONES AND SOFT TISSUES: No acute osseous abnormality. IMPRESSION: 1. No acute findings. Electronically signed by: Elsie Gravely MD 12/29/2024 06:08 PM EST RP Workstation: [id number]     Procedures   Medications Ordered in the ED  acetaminophen  (TYLENOL ) tablet 1,000 mg (1,000 mg Oral Given 12/29/24 1858)    79 y.o. female presents to the ED with complaints of headache, fall, hypertension, cough, The differential diagnosis includes pneumonia, intracranial bleed, intracranial mass, cranial fracture, cervical spine fracture, pneumonia, arrhythmia, ACS, CHF exacerbation (Ddx)  On arrival pt is nontoxic, vitals unremarkable.  Exam overall unremarkable.  I ordered medication Tylenol  for headache   Lab Tests:  BMP CBC troponin BNP.  BMP markable for elevated creatinine and decreased GFR which looks improved compared to previous lab work.  CBC overall unremarkable.  BMP unremarkable.  Troponin mildly elevated at 37 delta troponin decreased to 33  Imaging Studies ordered:  I ordered imaging studies which included chest x-ray, CT cervical spine, CT head.  Chest x-ray unremarkable.  CT cervical spine showed no acute fracture or traumatic subluxation.  Marked severity multilevel degenerative changes were noted.  CT head reported generalized cerebral atrophy with chronic white matter small vessel ischemic changes.  Small chronic left basal ganglia lacunar infarct, and moderate severity right maxillary sinus and moderate severity right ethmoid sinus.  ED Course:   Patient is sitting comfortably in ED bed in no acute distress nontoxic-appearing.  Patient was initially concern for hypertension today.  Highest reported blood pressure at home was 150/90.  After further discussion it was discovered patient did have a fall on Sunday with possible  LOC.  Imaging will be obtained for neck and head injury.  Patient denies any complaints or injuries from the fall.  Will also obtain BMP for reported edema and troponin for repeated syncope.  BNP unremarkable imaging unremarkable for acute abnormality.  Troponin mildly elevated at 37 pending delta.  Delta troponin was decreased to 33.  Patient reports headache had decreased mildly with Tylenol .  Patient was advised to take Tylenol  at home as needed for headache and to drink fluids.  It was advised to patient to follow-up with primary care tomorrow and to continue to monitor blood pressure.  Strict return precautions were discussed with patient and family at bedside and they agreed.  Patient was comfortable with discharge at this time.    Portions of this note were generated with Scientist, clinical (histocompatibility and immunogenetics). Dictation errors may occur despite best attempts at proofreading.   Final diagnoses:  Secondary hypertension    ED Discharge Orders     None          Myriam Fonda GORMAN DEVONNA 12/29/24 2134    Doretha Folks, MD 01/01/25 1328  "

## 2024-12-29 NOTE — Discharge Instructions (Signed)
 Imaging lab work and exam are overall reassuring today.  Please follow-up with your primary care tomorrow for reevaluation and continue to monitor blood pressure and take blood pressure medications at home.  If you experience any concerning new or worsening symptoms please return to the emergency department for further evaluation symptoms would include fainting, falls, chest pain, visual disturbances, severe headache, or shortness of breath.  If any of these or other concerning symptoms arise please return for further evaluation.
